# Patient Record
Sex: Female | Born: 1995
Health system: Southern US, Community
[De-identification: ages and names within clinical notes are randomized; demographics above are authoritative.]

## PROBLEM LIST (undated history)

## (undated) DIAGNOSIS — R011 Cardiac murmur, unspecified: Secondary | ICD-10-CM

## (undated) DIAGNOSIS — F419 Anxiety disorder, unspecified: Secondary | ICD-10-CM

## (undated) HISTORY — DX: Cardiac murmur, unspecified: R01.1

---
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
## 2016-04-02 ENCOUNTER — Other Ambulatory Visit: Payer: Self-pay | Admitting: Physician Assistant

## 2016-04-02 DIAGNOSIS — R42 Dizziness and giddiness: Secondary | ICD-10-CM

## 2016-04-12 ENCOUNTER — Other Ambulatory Visit: Payer: No Typology Code available for payment source

## 2016-05-10 ENCOUNTER — Other Ambulatory Visit: Payer: No Typology Code available for payment source

## 2016-06-26 ENCOUNTER — Other Ambulatory Visit: Payer: No Typology Code available for payment source

## 2016-08-09 ENCOUNTER — Other Ambulatory Visit: Payer: No Typology Code available for payment source

## 2016-08-16 ENCOUNTER — Ambulatory Visit
Admission: RE | Admit: 2016-08-16 | Discharge: 2016-08-16 | Disposition: A | Discharge status: Home / Self Care | Payer: BLUE CROSS/BLUE SHIELD | Source: Ambulatory Visit | Attending: Physician Assistant | Admitting: Physician Assistant

## 2016-08-16 ENCOUNTER — Other Ambulatory Visit: Payer: Self-pay | Admitting: Physician Assistant

## 2016-08-16 DIAGNOSIS — R42 Dizziness and giddiness: Secondary | ICD-10-CM

## 2017-09-09 IMAGING — MR MR HEAD W/O CM
8 series · 48 of 48 positions shown · IV contrast (agent unspecified)
Comparison: None.

CLINICAL DATA: 20-year-old female with dizziness, lightheadedness,
symptoms of feeling faint. Initial encounter.

A study without and with contrast was planned, but the patient
declined IV contrast.
EXAM:
MRI HEAD WITHOUT CONTRAST
TECHNIQUE: Multiplanar, multiecho pulse sequences of the brain and surrounding
structures were obtained without intravenous contrast.

[Series 6: DWI · axial · 3.0mm · 1.44mm/px · z∈[-89,+39]mm · 8 of 86 slices shown (1 of 2)]
[im 1/86]
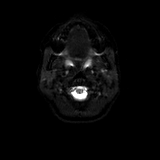
[im 13/86]
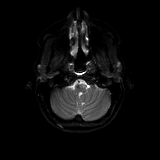
[im 25/86]
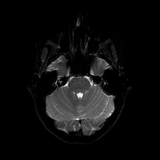
[im 37/86]
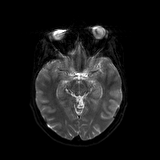
[im 49/86]
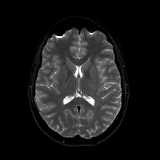
[im 61/86]
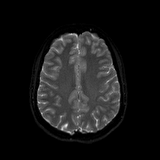
[im 73/86]
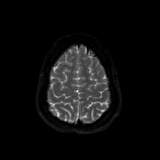
[im 86/86]
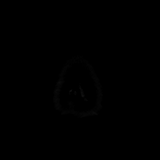

[Series 7: DWI · axial · 3.0mm · 1.44mm/px · z∈[-89,+39]mm · 4 of 42 slices shown (2 of 2)]
[im 1/42]
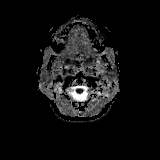
[im 14/42]
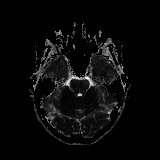
[im 28/42]
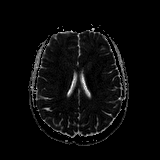
[im 42/42]
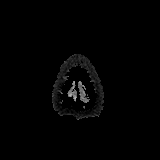

[Series 8: T2 · axial · 4.0mm · 0.36mm/px · z∈[-84,+43]mm · 3 of 27 slices shown (1 of 2)]
[im 1/27]
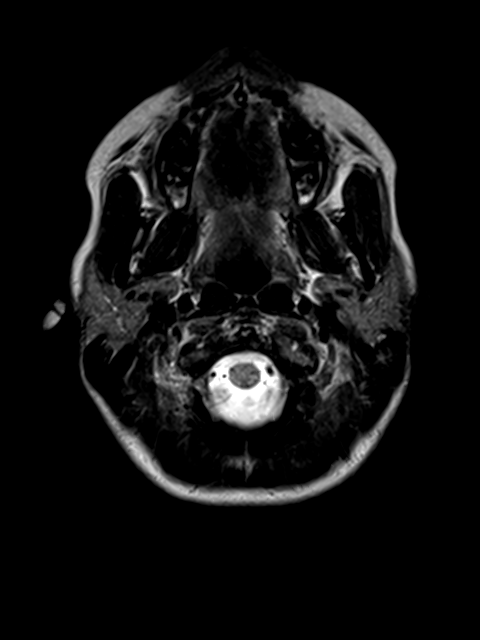
[im 14/27]
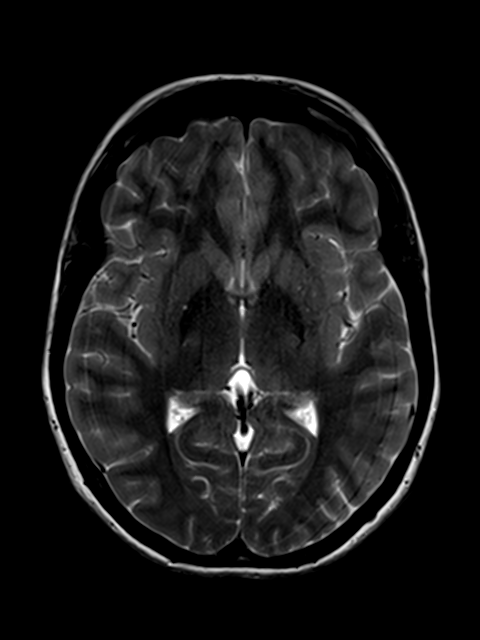
[im 27/27]
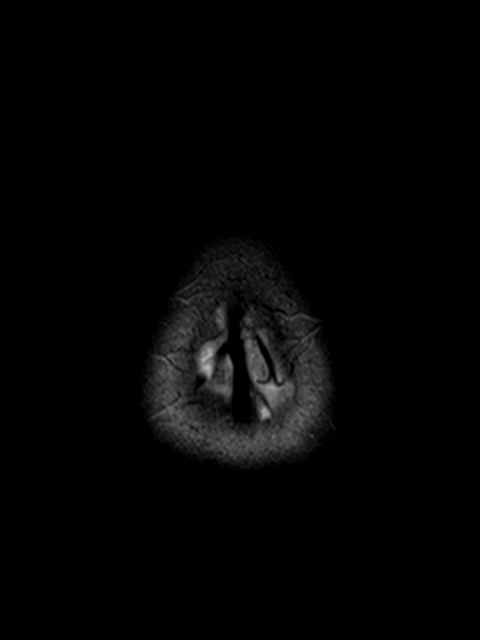

[Series 9: FLAIR · axial · 4.0mm · 0.72mm/px · z∈[-84,+43]mm · 3 of 27 slices shown]
[im 1/27]
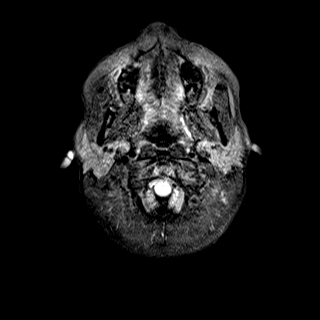
[im 14/27]
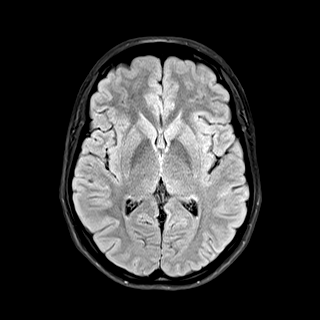
[im 27/27]
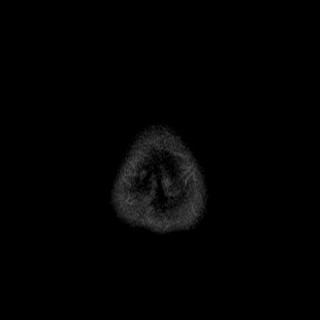

[Series 11: swi_images · axial · 1.5mm · 0.90mm/px · z∈[-87,+47]mm · 10 of 96 slices shown]
[im 1/96]
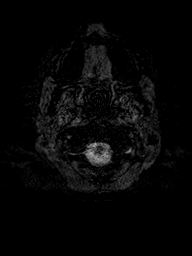
[im 11/96]
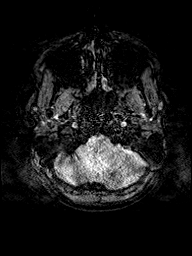
[im 22/96]
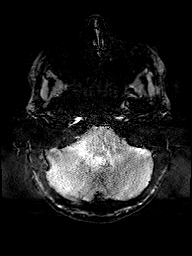
[im 32/96]
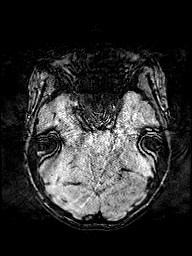
[im 43/96]
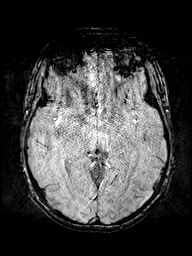
[im 53/96]
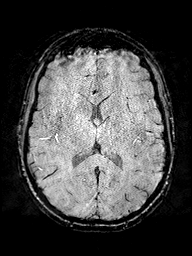
[im 64/96]
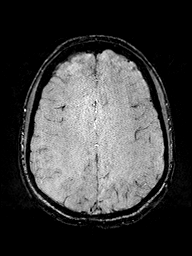
[im 74/96]
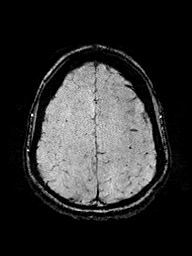
[im 85/96]
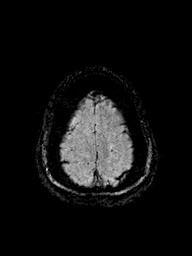
[im 96/96]
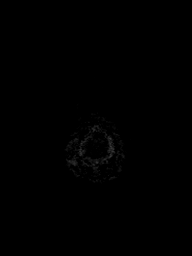

[Series 12: T1 · axial · 1.0mm · 0.90mm/px · z∈[-86,+48]mm · 14 of 144 slices shown (1 of 2)]
[im 1/144]
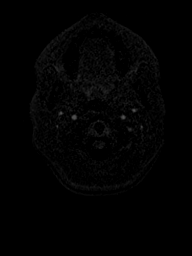
[im 12/144]
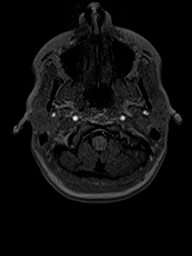
[im 23/144]
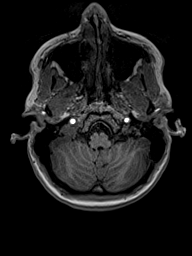
[im 34/144]
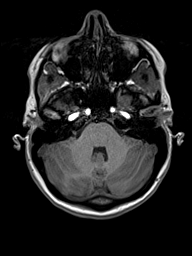
[im 45/144]
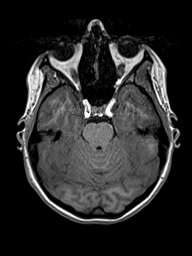
[im 56/144]
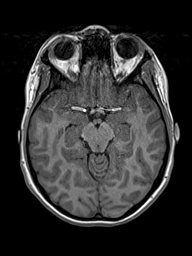
[im 67/144]
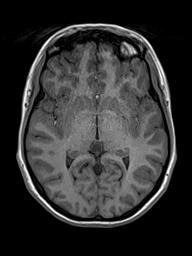
[im 78/144]
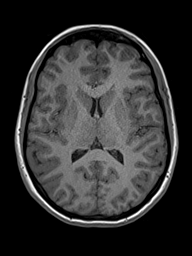
[im 89/144]
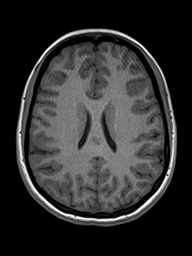
[im 100/144]
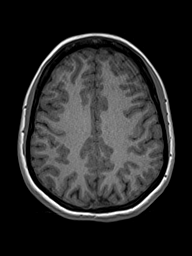
[im 111/144]
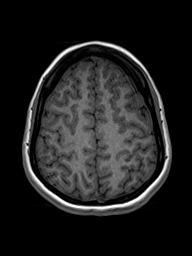
[im 122/144]
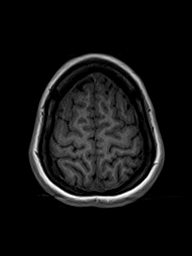
[im 133/144]
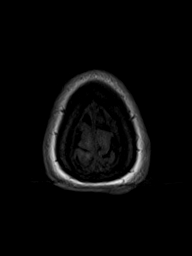
[im 144/144]
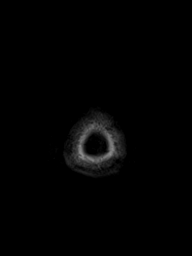

[Series 13: T2 · coronal · 4.5mm · 0.36mm/px · 3 of 30 slices shown (2 of 2)]
[im 1/30]
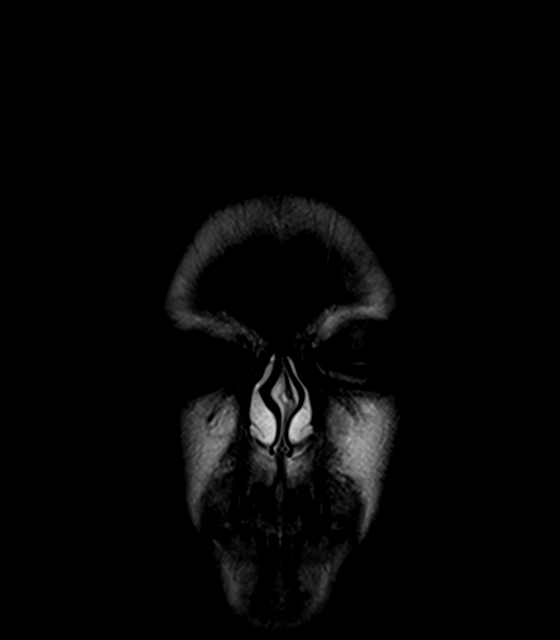
[im 15/30]
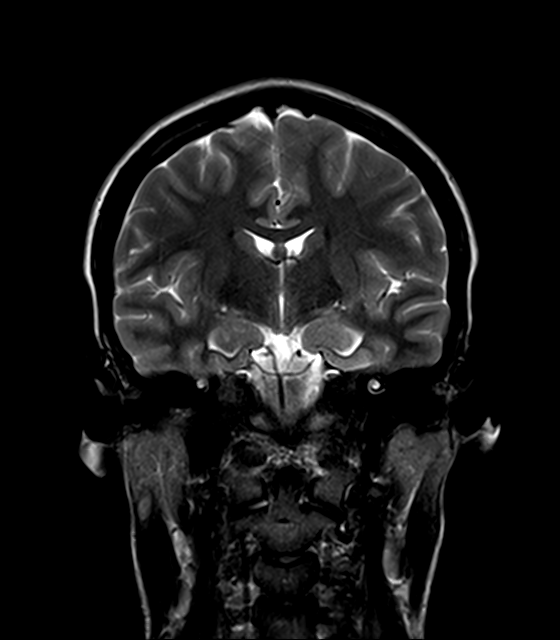
[im 30/30]
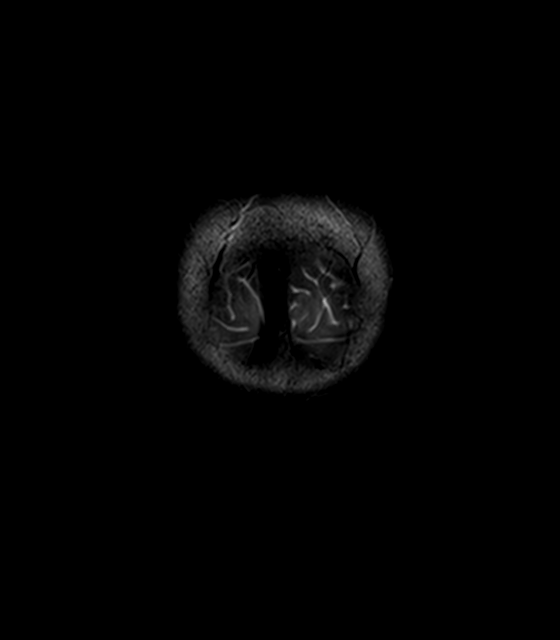

[Series 14: T1 · sagittal · 4.0mm · 0.75mm/px · 3 of 26 slices shown (2 of 2)]
[im 1/26]
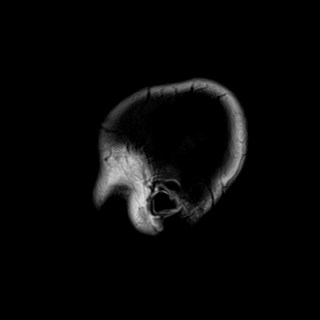
[im 13/26]
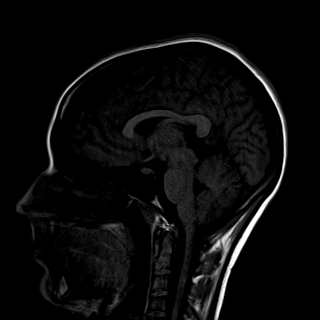
[im 26/26]
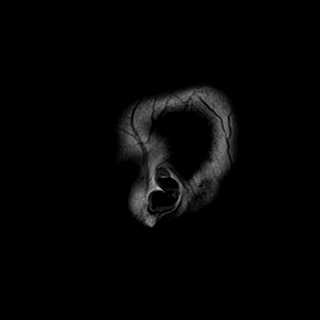

[48 of 48 positions shown; findings below may reference images not displayed]

FINDINGS: Brain: Cerebral volume is normal. No restricted diffusion to suggest
acute infarction. No midline shift, mass effect, evidence of mass
lesion, ventriculomegaly, extra-axial collection or acute
intracranial hemorrhage. Cervicomedullary junction and pituitary are
within normal limits. Small 8 mm cystic change of pineal gland,
normal variant. Gray and white matter signal is within normal limits
throughout the brain. No encephalomalacia or chronic cerebral blood
products.

Vascular: Major intracranial vascular flow voids appear normal,
probable fetal type bilateral PCA origins.

Skull and upper cervical spine: Negative. Visualized bone marrow
signal is within normal limits.

Sinuses/Orbits: Normal orbits soft tissues.

Paranasal sinuses and mastoids are clear.

Other: Visible internal auditory structures appear normal. Negative
scalp soft tissues.
IMPRESSION: Normal noncontrast MRI appearance of the brain.

## 2019-05-10 ENCOUNTER — Other Ambulatory Visit: Payer: Self-pay | Admitting: Adult Health

## 2019-05-15 ENCOUNTER — Ambulatory Visit (INDEPENDENT_AMBULATORY_CARE_PROVIDER_SITE_OTHER): Payer: BLUE CROSS/BLUE SHIELD | Admitting: Adult Health

## 2019-05-15 ENCOUNTER — Encounter: Payer: Self-pay | Admitting: Adult Health

## 2019-05-15 DIAGNOSIS — F411 Generalized anxiety disorder: Secondary | ICD-10-CM | POA: Diagnosis not present

## 2019-05-15 DIAGNOSIS — F331 Major depressive disorder, recurrent, moderate: Secondary | ICD-10-CM

## 2019-05-15 MED ORDER — ESCITALOPRAM OXALATE 10 MG PO TABS: 10.0000 mg | ORAL_TABLET | Freq: Every day | ORAL | 3 refills | Status: DC

## 2019-05-15 NOTE — Progress Notes (Signed)
Tara Hull [id number] [birth year] 23 y.o.  Virtual Visit via Telephone Note  I connected with pt on 05/15/19 at 11:40 AM EST by telephone and verified that I am speaking with the correct person using two identifiers.   I discussed the limitations, risks, security and privacy concerns of performing an evaluation and management service by telephone and the availability of in person appointments. I also discussed with the patient that there may be a patient responsible charge related to this service. The patient expressed understanding and agreed to proceed.   I discussed the assessment and treatment plan with the patient. The patient was provided an opportunity to ask questions and all were answered. The patient agreed with the plan and demonstrated an understanding of the instructions.   The patient was advised to call back or seek an in-person evaluation if the symptoms worsen or if the condition fails to improve as anticipated.  I provided 30 minutes of non-face-to-face time during this encounter.  The patient was located at home.  The provider was located at [place].   Aloha Gell, NP   Subjective:   Patient ID:  Tara Hull is a 23 y.o. (DOB March 01, 1996) female.  Chief Complaint: No chief complaint on file.  HPI [name] presents for follow-up of anxiety and depression.  Describes mood today as "pretty good". Pleasant. Mood symptoms - denies depression, anxiety, and irritability. Stating "I've been doing really well". Stable interest and motivation. Taking medications as prescribed.  Energy levels stable. Active, does not have a regular exercise routine. Working 40 hours a week.  Enjoys some usual interests and activities. Married. Lives with husband 2 dogs. Spending time with family. Parents recently divorced after 2 years of separation.  Appetite adequate. Weight stable. Sleeps well most nights. Averages 6 to 8 hours. Focus and  concentration stable. Completing tasks. Managing aspects of household. Work going well Careers information officer. Working on [employer].  Denies SI or HI. Denies AH or VH.  Review of Systems:  Review of Systems  Musculoskeletal: Negative for gait problem.  Neurological: Negative for tremors.  Psychiatric/Behavioral:       Please refer to HPI    Medications: I have reviewed the patient's current medications.  Current Outpatient Medications  Medication Sig Dispense Refill  . escitalopram (LEXAPRO) 10 MG tablet Take 1 tablet (10 mg total) by mouth daily. 90 tablet 3   No current facility-administered medications for this visit.    Medication Side Effects: None  Allergies: No Known Allergies  No past medical history on file.  Family History  Problem Relation Age of Onset  . Mental retardation Brother   . Other Brother        cleidocranial dysplasia    Social History   Socioeconomic History  . Marital status: Single    Spouse name: Not on file  . Number of children: Not on file  . Years of education: Not on file  . Highest education level: Not on file  Occupational History  . Not on file  Tobacco Use  . Smoking status: Never Smoker  . Smokeless tobacco: Never Used  Substance and Sexual Activity  . Alcohol use: No  . Drug use: No  . Sexual activity: Never  Other Topics Concern  . 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 Lives with her parents and younger brothers.   HC: 52.9 cm   PHQ-9  Total Score: 12   SCARED Total Score: 12   Social Determinants of Health   Financial Resource Strain:   . Difficulty of Paying Living Expenses: Not on file  Food Insecurity:   . Worried About Programme researcher, broadcasting/film/videounning Out of Food in the Last Year: Not on file  . Ran Out of Food in the Last Year: Not on file  Transportation Needs:   . Lack of Transportation (Medical): Not on file  . Lack of Transportation (Non-Medical): Not on file  Physical  Activity:   . Days of Exercise per Week: Not on file  . Minutes of Exercise per Session: Not on file  Stress:   . Feeling of Stress : Not on file  Social Connections:   . Frequency of Communication with Friends and Family: Not on file  . Frequency of Social Gatherings with Friends and Family: Not on file  . Attends Religious Services: Not on file  . Active Member of Clubs or Organizations: Not on file  . Attends BankerClub or Organization Meetings: Not on file  . Marital Status: Not on file  Intimate Partner Violence:   . Fear of Current or Ex-Partner: Not on file  . Emotionally Abused: Not on file  . Physically Abused: Not on file  . Sexually Abused: Not on file    Past Medical History, Surgical history, Social history, and Family history were reviewed and updated as appropriate.   Please see review of systems for further details on the patient's review from today.   Objective:   Physical Exam:  There were no vitals taken for this visit.  Physical Exam Constitutional:      General: She is not in acute distress.    Appearance: She is well-developed.  Musculoskeletal:        General: No deformity.  Neurological:     Mental Status: She is alert and oriented to person, place, and time.     Coordination: Coordination normal.  Psychiatric:        Attention and Perception: Attention and perception normal. She does not perceive auditory or visual hallucinations.        Mood and Affect: Mood normal. Mood is not anxious or depressed. Affect is not labile, blunt, angry or inappropriate.        Speech: Speech normal.        Behavior: Behavior normal.        Thought Content: Thought content normal. Thought content is not paranoid or delusional. Thought content does not include homicidal or suicidal ideation. Thought content does not include homicidal or suicidal plan.        Cognition and Memory: Cognition and memory normal.        Judgment: Judgment normal.     Comments: Insight intact     Lab Review:  No results found for: NA, K, CL, CO2, GLUCOSE, BUN, CREATININE, CALCIUM, PROT, ALBUMIN, AST, ALT, ALKPHOS, BILITOT, GFRNONAA, GFRAA  No results found for: WBC, RBC, HGB, HCT, PLT, MCV, MCH, MCHC, RDW, LYMPHSABS, MONOABS, EOSABS, BASOSABS  No results found for: POCLITH, LITHIUM   No results found for: PHENYTOIN, PHENOBARB, VALPROATE, CBMZ   .res Assessment: Plan:    Plan:  1. Lexapro 10mg  daily 5mg /2.5mg    RTC 1 year  Patient advised to contact office with any questions, adverse effects, or acute worsening in signs and symptoms.  Diagnoses and all orders for this visit:  Generalized anxiety disorder -     escitalopram (LEXAPRO) 10 MG tablet; Take 1 tablet (10 mg total)  by mouth daily.  Major depressive disorder, recurrent episode, moderate (HCC) -     escitalopram (LEXAPRO) 10 MG tablet; Take 1 tablet (10 mg total) by mouth daily.    Please see After Visit Summary for patient specific instructions.  No future appointments.  No orders of the defined types were placed in this encounter.     -------------------------------

## 2019-06-17 ENCOUNTER — Other Ambulatory Visit: Payer: Self-pay | Admitting: Adult Health

## 2019-06-17 DIAGNOSIS — F331 Major depressive disorder, recurrent, moderate: Secondary | ICD-10-CM

## 2019-06-17 DIAGNOSIS — F411 Generalized anxiety disorder: Secondary | ICD-10-CM

## 2019-06-21 ENCOUNTER — Telehealth: Payer: Self-pay | Admitting: Adult Health

## 2019-06-21 ENCOUNTER — Other Ambulatory Visit: Payer: Self-pay

## 2019-06-21 DIAGNOSIS — F331 Major depressive disorder, recurrent, moderate: Secondary | ICD-10-CM

## 2019-06-21 DIAGNOSIS — F411 Generalized anxiety disorder: Secondary | ICD-10-CM

## 2019-06-21 MED ORDER — ESCITALOPRAM OXALATE 10 MG PO TABS: 10.0000 mg | ORAL_TABLET | Freq: Every day | ORAL | 3 refills | Status: DC

## 2019-06-21 NOTE — Telephone Encounter (Signed)
Rx for Lexapro submitted to updated [facility] in [place]

## 2019-06-21 NOTE — Telephone Encounter (Signed)
Pt would like her lexapro and others that need refill  to be sent to new pharmacy [facility] [address] [place] [address].

## 2019-06-28 ENCOUNTER — Telehealth: Payer: Self-pay | Admitting: Adult Health

## 2019-06-28 NOTE — Telephone Encounter (Signed)
Tara Hull called to ask if she can take a powder drink w/ magnesium and ashwagandha with the lexapro she takes.  She said it is to help with anxiety.  Please call to let her know what you think.

## 2019-06-28 NOTE — Telephone Encounter (Signed)
I would need to research this.

## 2019-06-30 NOTE — Telephone Encounter (Signed)
Pt. Made aware.

## 2019-12-20 ENCOUNTER — Emergency Department (HOSPITAL_COMMUNITY)
Admission: EM | Admit: 2019-12-20 | Discharge: 2019-12-21 | Disposition: A | Discharge status: Home / Self Care | Payer: BC Managed Care – PPO | Attending: Emergency Medicine | Admitting: Emergency Medicine

## 2019-12-20 DIAGNOSIS — R42 Dizziness and giddiness: Secondary | ICD-10-CM | POA: Diagnosis present

## 2019-12-20 DIAGNOSIS — Z5321 Procedure and treatment not carried out due to patient leaving prior to being seen by health care provider: Secondary | ICD-10-CM | POA: Insufficient documentation

## 2019-12-21 ENCOUNTER — Encounter (HOSPITAL_COMMUNITY): Payer: Self-pay | Admitting: Emergency Medicine

## 2019-12-21 ENCOUNTER — Other Ambulatory Visit: Payer: Self-pay

## 2019-12-21 LAB — URINALYSIS, ROUTINE W REFLEX MICROSCOPIC
Bilirubin Urine: NEGATIVE
Glucose, UA: NEGATIVE mg/dL
Hgb urine dipstick: NEGATIVE
Ketones, ur: NEGATIVE mg/dL
Leukocytes,Ua: NEGATIVE
Nitrite: NEGATIVE
Protein, ur: NEGATIVE mg/dL
Specific Gravity, Urine: 1.005 (ref 1.005–1.030)
pH: 7 (ref 5.0–8.0)

## 2019-12-21 LAB — CBC
HCT: 42.2 % (ref 36.0–46.0)
Hemoglobin: 14 g/dL (ref 12.0–15.0)
MCH: 29.2 pg (ref 26.0–34.0)
MCHC: 33.2 g/dL (ref 30.0–36.0)
MCV: 88.1 fL (ref 80.0–100.0)
Platelets: 258 10*3/uL (ref 150–400)
RBC: 4.79 MIL/uL (ref 3.87–5.11)
RDW: 11.9 % (ref 11.5–15.5)
WBC: 7.6 10*3/uL (ref 4.0–10.5)
nRBC: 0 % (ref 0.0–0.2)

## 2019-12-21 LAB — I-STAT BETA HCG BLOOD, ED (MC, WL, AP ONLY): I-stat hCG, quantitative: <5 m[IU]/mL (ref <5)

## 2019-12-21 LAB — BASIC METABOLIC PANEL
Anion gap: 9 (ref 5–15)
BUN: 15 mg/dL (ref 6–20)
CO2: 27 mmol/L (ref 22–32)
Calcium: 9.6 mg/dL (ref 8.9–10.3)
Chloride: 103 mmol/L (ref 98–111)
Creatinine, Ser: 0.88 mg/dL (ref 0.44–1.00)
GFR calc Af Amer: >60 mL/min (ref >60)
GFR calc non Af Amer: >60 mL/min (ref >60)
Glucose, Bld: 154 mg/dL — ABNORMAL HIGH (ref 70–99)
Potassium: 3.5 mmol/L (ref 3.5–5.1)
Sodium: 139 mmol/L (ref 135–145)

## 2019-12-21 MED ORDER — SODIUM CHLORIDE 0.9% FLUSH: 3.0000 mL | Freq: Once | INTRAVENOUS | Status: DC

## 2019-12-21 NOTE — ED Triage Notes (Signed)
Pt presents to ED POV. Pt reports dizziness and weakness in all extremities. Pt reports that OB prescribed progesterone and 58m after she had a episode of dizziness and weakness @ 2130. Pt reports no neuro deficits at this time. NIHH - 0.

## 2019-12-21 NOTE — ED Notes (Signed)
Patient states she called her ob and has an appointment for the morning.

## 2020-05-25 NOTE — L&D Delivery Note (Signed)
Operative Delivery Note At 11:20 PM a viable female was delivered via Vaginal, Spontaneous.  Presentation: vertex; Position: Left,, Occiput,, Anterior; Station: +3.  Verbal consent: obtained from patient.  Risks and benefits discussed in detail.  Risks include, but are not limited to the risks of anesthesia, bleeding, infection, damage to maternal tissues, fetal cephalhematoma.  There is also the risk of inability to effect vaginal delivery of the head, or shoulder dystocia that cannot be resolved by established maneuvers, leading to the need for emergency cesarean section. She pushed with excellent effort and fetal HR decel to 70s x 3 minutes. Expeditious delivery recommended. Kiwi appled without any pop offs and gently over one contraction to deliver easily the head followed by a nuchal x 1.   APGAR: 7, 8; weight 6 lb 10.4 oz (3016 g).   Placenta status: routine Cord:  with the following complications: nuchel x 1 .  Cord pH: not sent  Anesthesia:  CLE Instruments: kiwi Episiotomy: None Lacerations: 2nd degree Suture Repair: 2.0 3.0 vicryl Est. Blood Loss (mL): 200   Mom to postpartum.  Baby to Couplet care / Skin to Skin.  Ranae Pila 02/02/2021, 12:45 AM

## 2020-06-03 ENCOUNTER — Telehealth: Payer: Self-pay | Admitting: Adult Health

## 2020-06-03 NOTE — Telephone Encounter (Signed)
Tara Hull called to report that she is pregnant.  She would like a call asap as to how to wean off her medications that may hurt the baby.  Please call to discuss instructions on her medications.

## 2020-06-03 NOTE — Telephone Encounter (Signed)
She has not been seen here in over a year. She was on Lexapro at last visit. The Lexapro is usually fine to take during pregnancy, but she will need to consult with her OB.

## 2020-06-06 ENCOUNTER — Ambulatory Visit: Payer: BC Managed Care – PPO | Admitting: Physician Assistant

## 2020-06-07 NOTE — Telephone Encounter (Signed)
I spoke with Tara Hull and let her know that the Lexapro is usually ok during pregnancy but she should consult her OB.  She acknowledged this and said she would check with her Dr.

## 2020-06-09 NOTE — Telephone Encounter (Signed)
Noted  

## 2020-06-27 LAB — OB RESULTS CONSOLE GC/CHLAMYDIA
Chlamydia: NEGATIVE
Gonorrhea: NEGATIVE

## 2020-06-27 LAB — OB RESULTS CONSOLE ABO/RH: RH Type: POSITIVE

## 2020-06-27 LAB — OB RESULTS CONSOLE ANTIBODY SCREEN: Antibody Screen: NEGATIVE

## 2020-06-27 LAB — OB RESULTS CONSOLE HIV ANTIBODY (ROUTINE TESTING): HIV: NONREACTIVE

## 2020-06-27 LAB — OB RESULTS CONSOLE HEPATITIS B SURFACE ANTIGEN: Hepatitis B Surface Ag: NEGATIVE

## 2020-06-27 LAB — OB RESULTS CONSOLE RUBELLA ANTIBODY, IGM: Rubella: IMMUNE

## 2020-06-27 LAB — OB RESULTS CONSOLE RPR: RPR: NONREACTIVE

## 2020-07-07 ENCOUNTER — Ambulatory Visit (HOSPITAL_COMMUNITY)
Admission: EM | Admit: 2020-07-07 | Discharge: 2020-07-07 | Disposition: A | Discharge status: Still In Hospital | Payer: BC Managed Care – PPO

## 2020-07-07 ENCOUNTER — Emergency Department (HOSPITAL_COMMUNITY)
Admission: EM | Admit: 2020-07-07 | Discharge: 2020-07-07 | Disposition: A | Discharge status: Home / Self Care | Payer: BC Managed Care – PPO | Attending: Emergency Medicine | Admitting: Emergency Medicine

## 2020-07-07 ENCOUNTER — Encounter (HOSPITAL_COMMUNITY): Payer: Self-pay | Admitting: Emergency Medicine

## 2020-07-07 ENCOUNTER — Emergency Department (HOSPITAL_COMMUNITY): Payer: BC Managed Care – PPO

## 2020-07-07 ENCOUNTER — Other Ambulatory Visit: Payer: Self-pay

## 2020-07-07 DIAGNOSIS — Z3A09 9 weeks gestation of pregnancy: Secondary | ICD-10-CM | POA: Insufficient documentation

## 2020-07-07 DIAGNOSIS — O99511 Diseases of the respiratory system complicating pregnancy, first trimester: Secondary | ICD-10-CM | POA: Insufficient documentation

## 2020-07-07 DIAGNOSIS — R070 Pain in throat: Secondary | ICD-10-CM | POA: Insufficient documentation

## 2020-07-07 DIAGNOSIS — R079 Chest pain, unspecified: Secondary | ICD-10-CM | POA: Insufficient documentation

## 2020-07-07 MED ORDER — LIDOCAINE VISCOUS HCL 2 % MT SOLN
15.0000 mL | Freq: Once | OROMUCOSAL | Status: AC
  Administered 2020-07-07: 15 mL via ORAL
  Filled 2020-07-07: qty 15

## 2020-07-07 MED ORDER — ALUM & MAG HYDROXIDE-SIMETH 200-200-20 MG/5ML PO SUSP
30.0000 mL | Freq: Once | ORAL | Status: AC
  Administered 2020-07-07: 30 mL via ORAL
  Filled 2020-07-07: qty 30

## 2020-07-07 NOTE — ED Provider Notes (Incomplete)
  [facility] EMERGENCY DEPARTMENT Provider Note   CSN: [id number] Arrival date & time: 07/07/20  1530     History Chief Complaint  Patient presents with  . swallowed gum    [redacted] weeks pregnant    Tara Hull is a 25 y.o. female.  HPI     History reviewed. No pertinent past medical history.  Patient Active Problem List   Diagnosis Date Noted  . Dizziness and giddiness 07/04/2015  . Orthostatic hypotension 07/04/2015    Past Surgical History:  Procedure Laterality Date  . WISDOM TOOTH EXTRACTION  2015     OB History   No obstetric history on file.     Family History  Problem Relation Age of Onset  . Mental retardation Brother   . Other Brother        cleidocranial dysplasia    Social History   Tobacco Use  . Smoking status: Never Smoker  . Smokeless tobacco: Never Used  Substance Use Topics  . Alcohol use: No  . Drug use: No    Home Medications Prior to Admission medications   Medication Sig Start Date End Date Taking? Authorizing Provider  escitalopram (LEXAPRO) 10 MG tablet Take 1 tablet (10 mg total) by mouth daily. 06/21/19   Mozingo, Thereasa Solo, NP    Allergies    Patient has no known allergies.  Review of Systems   Review of Systems  Physical Exam Updated Vital Signs BP 135/81 (BP Location: Right Arm)   Pulse (!) 105   Temp 98.5 F (36.9 C) (Oral)   Resp 18   SpO2 100%   Physical Exam  ED Results / Procedures / Treatments   Labs (all labs ordered are listed, but only abnormal results are displayed) Labs Reviewed - No data to display  EKG None  Radiology No results found.  Procedures Procedures {Remember to document critical care time when appropriate:1}  Medications Ordered in ED Medications - No data to display  ED Course  I have reviewed the triage vital signs and the nursing notes.  Pertinent labs & imaging results that were available during my care of the patient were reviewed by me  and considered in my medical decision making (see chart for details).    MDM Rules/Calculators/A&P                          *** Final Clinical Impression(s) / ED Diagnoses Final diagnoses:  None    Rx / DC Orders ED Discharge Orders    None

## 2020-07-07 NOTE — ED Provider Notes (Signed)
[facility] EMERGENCY DEPARTMENT Provider Note   CSN: [id number] Arrival date & time: 07/07/20  1530     History Chief Complaint  Patient presents with  . swallowed gum    [redacted] weeks pregnant    Tara Hull is a 25 y.o. female with a h/o orthostatic hypotension who presents to the ED with her husband for throat pain. Swallowed a piece of gum yesterday at approximately 4PM. Later than evening, she began having pain in her throat and upper chest at the base of her neck as if something was stuck. Tolerating fluids and solids but still having discomfort today, prompting her to come to ED today. Denies vomiting, nausea, SOB, abdominal pain, fever, chills, or diarrhea.  The history is provided by the patient and medical records.  Illness Location:  Throat Quality:  Foreign body sensation Severity:  Mild Onset quality:  Sudden Duration:  1 day Timing:  Constant Progression:  Unchanged Chronicity:  New Context:  Swallowed piece of gum Relieved by:  Nothing Worsened by:  Nothing Ineffective treatments:  Fluids, Tums Associated symptoms: chest pain   Associated symptoms: no abdominal pain, no cough, no diarrhea, no ear pain, no fever, no nausea, no rash, no shortness of breath, no sore throat and no vomiting        History reviewed. No pertinent past medical history.  Patient Active Problem List   Diagnosis Date Noted  . Dizziness and giddiness 07/04/2015  . Orthostatic hypotension 07/04/2015    Past Surgical History:  Procedure Laterality Date  . WISDOM TOOTH EXTRACTION  2015     OB History   No obstetric history on file.     Family History  Problem Relation Age of Onset  . Mental retardation Brother   . Other Brother        cleidocranial dysplasia    Social History   Tobacco Use  . Smoking status: Never Smoker  . Smokeless tobacco: Never Used  Substance Use Topics  . Alcohol use: No  . Drug use: No    Home Medications Prior to  Admission medications   Medication Sig Start Date End Date Taking? Authorizing Provider  escitalopram (LEXAPRO) 10 MG tablet Take 1 tablet (10 mg total) by mouth daily. 06/21/19   Mozingo, Thereasa Solo, NP    Allergies    Patient has no known allergies.  Review of Systems   Review of Systems  Constitutional: Negative for chills and fever.  HENT: Negative for ear pain, sore throat and trouble swallowing.   Eyes: Negative for pain and visual disturbance.  Respiratory: Negative for cough and shortness of breath.   Cardiovascular: Positive for chest pain. Negative for palpitations.  Gastrointestinal: Negative for abdominal pain, diarrhea, nausea and vomiting.  Genitourinary: Negative for dysuria and hematuria.  Musculoskeletal: Negative for arthralgias and back pain.  Skin: Negative for color change and rash.  Neurological: Negative for seizures and syncope.  All other systems reviewed and are negative.   Physical Exam Updated Vital Signs BP 105/61 (BP Location: Right Arm)   Pulse 82   Temp 98.5 F (36.9 C) (Oral)   Resp 16   SpO2 97%   Physical Exam Vitals and nursing note reviewed.  Constitutional:      General: She is awake. She is not in acute distress.    Appearance: Normal appearance. She is well-developed, well-groomed and well-nourished. She is not ill-appearing.  HENT:     Head: Normocephalic and atraumatic.     Right  Ear: External ear normal.     Left Ear: External ear normal.     Nose: Nose normal.  Eyes:     General: No scleral icterus.       Right eye: No discharge.        Left eye: No discharge.     Conjunctiva/sclera: Conjunctivae normal.  Cardiovascular:     Rate and Rhythm: Normal rate and regular rhythm.  Pulmonary:     Effort: Pulmonary effort is normal. No respiratory distress.  Abdominal:     General: Abdomen is flat. There is no distension.     Palpations: Abdomen is soft.     Tenderness: There is no abdominal tenderness. There is no guarding  or rebound.  Musculoskeletal:        General: No edema.     Cervical back: Neck supple.  Skin:    General: Skin is warm and dry.     Findings: No rash.  Neurological:     General: No focal deficit present.     Mental Status: She is alert and oriented to person, place, and time.     Sensory: No sensory deficit.     Motor: No weakness.  Psychiatric:        Mood and Affect: Mood and affect and mood normal.        Behavior: Behavior normal. Behavior is cooperative.     ED Results / Procedures / Treatments   Labs (all labs ordered are listed, but only abnormal results are displayed) Labs Reviewed - No data to display  EKG None  Radiology DG Chest Portable 1 View  Result Date: 07/07/2020 CLINICAL DATA:  Swallowed foreign body. EXAM: PORTABLE CHEST 1 VIEW COMPARISON:  None. FINDINGS: The cardiac silhouette, mediastinal and hilar contours are normal. The lungs are clear.  No pleural effusions.  No pulmonary lesions. The upper abdominal bowel gas pattern is unremarkable. No radiopaque foreign bodies are identified. The bony thorax is intact. IMPRESSION: Normal chest x-ray. Electronically Signed   By: Rudie Meyer M.D.   On: 07/07/2020 17:11    Procedures Procedures  Medications Ordered in ED Medications  alum & mag hydroxide-simeth (MAALOX/MYLANTA) 200-200-20 MG/5ML suspension 30 mL (30 mLs Oral Given 07/07/20 1656)    And  lidocaine (XYLOCAINE) 2 % viscous mouth solution 15 mL (15 mLs Oral Given 07/07/20 1656)    ED Course  I have reviewed the triage vital signs and the nursing notes.  Pertinent labs & imaging results that were available during my care of the patient were reviewed by me and considered in my medical decision making (see chart for details).    MDM Rules/Calculators/A&P                          Patient is a well-appearing 24yoF with history and physical as described above who presents to the ED for throat pain. VS reassuring and HDS. Resting comfortably and in  no acute distress. Tolerating fluids in ED. No episodes of emesis. Initial workup includes CXR. Initial treatment includes viscous lidocaine and Maalox.  On reassessment, no significant relief from GI cocktail. CXR reassuring for esophageal rupture, widened mediastinum, PNA, aspiration, or PTX. Given reassuring VS, physical exam, and diagnostic workup, low suspicion for acute emergent pathology at this time. Suspect presentation likely 2/2 gastric reflux vs pregnancy. Recommend she follow up with PCP or OB if symptoms do not improve to discuss anti-reflux medication that is safe in pregnancy. Strict return precautions  provided and discussed. Questions and concerns were addressed. Patient verbalized understanding and amenable with discharge plan. Patient discharged in stable condition. Final Clinical Impression(s) / ED Diagnoses Final diagnoses:  Pain in throat    Rx / DC Orders ED Discharge Orders    None       Tonia Brooms, MD 07/08/20 0136    Virgina Norfolk, DO 07/08/20 2333

## 2020-07-07 NOTE — ED Triage Notes (Addendum)
Pt states she swallowed her gum yesterday around 4pm and has discomfort in her throat/esophagus.  States she is [redacted] weeks pregnant.

## 2020-07-07 NOTE — ED Provider Notes (Signed)
I have personally seen and examined the patient. I have reviewed the documentation on PMH/FH/Soc Hx. I have discussed the plan of care with the resident and patient.  I have reviewed and agree with the resident's documentation. Please see associated encounter note.  Briefly, the patient is a 25 y.o. female here with swallowed gum.  No respiratory symptoms.  Chest x-ray normal.  Overall patient with some mild reflux symptoms.  Was able to tolerate p.o. including solids since this happened as well.  Given reassurance and discharged from ED in good condition.  If continues have reflux symptoms recommend talking with OB about that as patient is [redacted] weeks pregnant and symptoms could be secondary to pregnancy.  Discharged in good condition.  This chart was dictated using voice recognition software.  Despite best efforts to proofread,  errors can occur which can change the documentation meaning.     EKG Interpretation None         Virgina Norfolk, DO 07/07/20 1733

## 2020-08-18 ENCOUNTER — Inpatient Hospital Stay (HOSPITAL_BASED_OUTPATIENT_CLINIC_OR_DEPARTMENT_OTHER): Payer: BC Managed Care – PPO

## 2020-08-18 ENCOUNTER — Inpatient Hospital Stay: Payer: BC Managed Care – PPO

## 2020-08-18 ENCOUNTER — Encounter (HOSPITAL_COMMUNITY): Payer: Self-pay

## 2020-08-18 ENCOUNTER — Other Ambulatory Visit: Payer: Self-pay

## 2020-08-18 ENCOUNTER — Inpatient Hospital Stay (HOSPITAL_COMMUNITY)
Admission: AD | Admit: 2020-08-18 | Discharge: 2020-08-18 | Disposition: A | Discharge status: Home / Self Care | Payer: BC Managed Care – PPO | Source: Ambulatory Visit | Attending: Obstetrics and Gynecology | Admitting: Obstetrics and Gynecology

## 2020-08-18 DIAGNOSIS — R109 Unspecified abdominal pain: Secondary | ICD-10-CM | POA: Diagnosis not present

## 2020-08-18 DIAGNOSIS — O26892 Other specified pregnancy related conditions, second trimester: Secondary | ICD-10-CM | POA: Diagnosis present

## 2020-08-18 DIAGNOSIS — Z0371 Encounter for suspected problem with amniotic cavity and membrane ruled out: Secondary | ICD-10-CM | POA: Insufficient documentation

## 2020-08-18 DIAGNOSIS — Z3A15 15 weeks gestation of pregnancy: Secondary | ICD-10-CM

## 2020-08-18 DIAGNOSIS — R102 Pelvic and perineal pain: Secondary | ICD-10-CM | POA: Diagnosis not present

## 2020-08-18 DIAGNOSIS — Z79899 Other long term (current) drug therapy: Secondary | ICD-10-CM | POA: Diagnosis not present

## 2020-08-18 DIAGNOSIS — O4290 Premature rupture of membranes, unspecified as to length of time between rupture and onset of labor, unspecified weeks of gestation: Secondary | ICD-10-CM

## 2020-08-18 DIAGNOSIS — N898 Other specified noninflammatory disorders of vagina: Secondary | ICD-10-CM

## 2020-08-18 LAB — AMNISURE RUPTURE OF MEMBRANE (ROM) NOT AT ARMC: Amnisure ROM: NEGATIVE

## 2020-08-18 LAB — WET PREP, GENITAL
Clue Cells Wet Prep HPF POC: NONE SEEN
Sperm: NONE SEEN
Trich, Wet Prep: NONE SEEN
Yeast Wet Prep HPF POC: NONE SEEN

## 2020-08-18 LAB — URINALYSIS, ROUTINE W REFLEX MICROSCOPIC
Bilirubin Urine: NEGATIVE
Glucose, UA: NEGATIVE mg/dL
Hgb urine dipstick: NEGATIVE
Ketones, ur: NEGATIVE mg/dL
Leukocytes,Ua: NEGATIVE
Nitrite: NEGATIVE
Protein, ur: NEGATIVE mg/dL
Specific Gravity, Urine: <1.005 — ABNORMAL LOW (ref 1.005–1.030)
pH: 6 (ref 5.0–8.0)

## 2020-08-18 NOTE — Discharge Instructions (Signed)
Reasons to return to MAU at [facility]:  Since you are preterm, return to MAU if:  1.  Contractions are 10 minutes apart or less and they becoming more uncomfortable or painful over time 2.  You have a large gush of fluid, or a trickle of fluid that will not stop and you have to wear a pad 3.  You have bleeding that is bright red, heavier than spotting--like menstrual bleeding (spotting can be normal in early labor or after a check of your cervix) 4.  You do not feel the baby moving like he/she normally does  

## 2020-08-18 NOTE — MAU Note (Signed)
Pt reports to mau with reports of leaking a clear fluid and cramping for the past 4 days.  Pt denies bleeding. Denies vag itching or urinary urgency.

## 2020-08-18 NOTE — MAU Provider Note (Signed)
Chief Complaint: leaking clear fluid and cramping and Abdominal Pain   Event Date/Time   First Provider Initiated Contact with Patient 08/18/20 1824      SUBJECTIVE HPI: Tara Hull is a 25 y.o. G1P0 at [redacted]w[redacted]d by LMP who presents to maternity admissions reporting leaking clear fluid x 4 days. She reports mild abdominal cramping bilaterally as an associated symptom. The leaking is clear, enough to soak a pantyliner 1-2 times per day but not enough to require a pad. There is no itching/burning or odor.    Location: lower/mid abdomen Quality: cramping Severity: 3/10 on pain scale Duration: 4 days Timing: intermittent, irregular Modifying factors: none Associated signs and symptoms: leaking fluid  HPI  History reviewed. No pertinent past medical history. Past Surgical History:  Procedure Laterality Date  . WISDOM TOOTH EXTRACTION  2015   Social History   Socioeconomic History  . Marital status: Married    Spouse name: Not on file  . Number of children: Not on file  . Years of education: Not on file  . Highest education level: Not on file  Occupational History  . Not on file  Tobacco Use  . Smoking status: Never Smoker  . Smokeless tobacco: Never Used  Substance and Sexual Activity  . Alcohol use: No  . Drug use: No  . Sexual activity: Never  Other Topics Concern  . 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 She works at [employer]. Lives with her parents and younger brothers.   HC: 52.9 cm   PHQ-9 Total Score: 12   SCARED Total Score: 12   Social Determinants of Health   Financial Resource Strain: Not on file  Food Insecurity: Not on file  Transportation Needs: Not on file  Physical Activity: Not on file  Stress: Not on file  Social Connections: Not on file  Intimate Partner Violence: Not on file   No current facility-administered medications on file prior to encounter.   Current Outpatient Medications on File  Prior to Encounter  Medication Sig Dispense Refill  . Prenatal Vit-Fe Fumarate-FA (MULTIVITAMIN-PRENATAL) 27-0.8 MG TABS tablet Take 1 tablet by mouth daily at 12 noon.    . escitalopram (LEXAPRO) 10 MG tablet Take 1 tablet (10 mg total) by mouth daily. 90 tablet 3   Allergies  Allergen Reactions  . Progesterone Other (See Comments)    Patient stated that all four limbs went numb. She stated that she can take the artifical version of progesterone, she stated that she received it and was fine after.    ROS:  Review of Systems  Constitutional: Negative for chills, fatigue and fever.  Respiratory: Negative for shortness of breath.   Cardiovascular: Negative for chest pain.  Gastrointestinal: Positive for abdominal pain. Negative for nausea and vomiting.  Genitourinary: Positive for vaginal discharge. Negative for difficulty urinating, dysuria, flank pain, pelvic pain, vaginal bleeding and vaginal pain.  Musculoskeletal: Negative for back pain.  Neurological: Negative for dizziness and headaches.  Psychiatric/Behavioral: Negative.      I have reviewed patient's Past Medical Hx, Surgical Hx, Family Hx, Social Hx, medications and allergies.   Physical Exam   Patient Vitals for the past 24 hrs:  BP Temp Temp src Pulse Resp SpO2  08/18/20 1710 - - - - - 98 %  08/18/20 1705 - - - - - 99 %  08/18/20 1702 120/63 - - (!) 112 - -  08/18/20 1648 120/73 98.7 F (37.1  C) Oral (!) 117 17 100 %   Constitutional: Well-developed, well-nourished female in no acute distress.  Cardiovascular: normal rate Respiratory: normal effort GI: Abd soft, non-tender. Pos BS x 4 MS: Extremities nontender, no edema, normal ROM Neurologic: Alert and oriented x 4.  GU: Neg CVAT.  PELVIC EXAM: Cervix pink, visually closed and long, without lesion, scant white creamy discharge, no pooling of fluid with Valsalva, vaginal walls and external genitalia normal   FHT 151 by doppler  LAB RESULTS Results for  orders placed or performed during the hospital encounter of 08/18/20 (from the past 24 hour(s))  Urinalysis, Routine w reflex microscopic Urine, Clean Catch     Status: Abnormal   Collection Time: 08/18/20  4:42 PM  Result Value Ref Range   Color, Urine YELLOW YELLOW   APPearance CLEAR CLEAR   Specific Gravity, Urine <1.005 (L) 1.005 - 1.030   pH 6.0 5.0 - 8.0   Glucose, UA NEGATIVE NEGATIVE mg/dL   Hgb urine dipstick NEGATIVE NEGATIVE   Bilirubin Urine NEGATIVE NEGATIVE   Ketones, ur NEGATIVE NEGATIVE mg/dL   Protein, ur NEGATIVE NEGATIVE mg/dL   Nitrite NEGATIVE NEGATIVE   Leukocytes,Ua NEGATIVE NEGATIVE  Wet prep, genital     Status: Abnormal   Collection Time: 08/18/20  6:12 PM   Specimen: Genital  Result Value Ref Range   Yeast Wet Prep HPF POC NONE SEEN NONE SEEN   Trich, Wet Prep NONE SEEN NONE SEEN   Clue Cells Wet Prep HPF POC NONE SEEN NONE SEEN   WBC, Wet Prep HPF POC MANY (A) NONE SEEN   Sperm NONE SEEN   Amnisure rupture of membrane (rom)not at [facility]     Status: None   Collection Time: 08/18/20  7:04 PM  Result Value Ref Range   Amnisure ROM NEGATIVE        IMAGING No results found.  MAU Management/MDM: Orders Placed This Encounter  Procedures  . Wet prep, genital  . [place] MFM OB LIMITED  . Urinalysis, Routine w reflex microscopic  . Amnisure rupture of membrane (rom)not at [facility]  . Discharge patient    No orders of the defined types were placed in this encounter.   No pooling of fluid on pelvic exam, negative ferning on slide taken. Given report of 4 days of leaking fluid, obtained amnisure and Limited OB US.  Amnisure negative and Limited OB US with normal AFI. Cervical length 4.44cm on [place]. No evidence of ROM or of preterm labor D/C home Increase PO fluids, preterm labor precautions reviewed F/U in office as scheduled   ASSESSMENT 1. Encounter for suspected premature rupture of amniotic membranes, with rupture of membranes not found   2. Abdominal  pain during pregnancy in second trimester   3. Vaginal discharge during pregnancy, second trimester   4. [redacted] weeks gestation of pregnancy     PLAN Discharge home Allergies as of 08/18/2020      Reactions   Progesterone Other (See Comments)   Patient stated that all four limbs went numb. She stated that she can take the artifical version of progesterone, she stated that she received it and was fine after.      Medication List    TAKE these medications   escitalopram 10 MG tablet Commonly known as: Lexapro Take 1 tablet (10 mg total) by mouth daily.   multivitamin-prenatal 27-0.8 MG Tabs tablet Take 1 tablet by mouth daily at 12 noon.       Follow-up Information  [place] Follow up.   Why: As scheduled, return to MAU as needed for emergencies. Contact information: [address] [place] [address] [phone]               Sharen Counter Certified Nurse-Midwife 08/18/2020  7:41 PM

## 2020-08-19 DIAGNOSIS — Z3A15 15 weeks gestation of pregnancy: Secondary | ICD-10-CM | POA: Diagnosis not present

## 2020-08-19 DIAGNOSIS — O26892 Other specified pregnancy related conditions, second trimester: Secondary | ICD-10-CM

## 2020-08-19 DIAGNOSIS — Z3686 Encounter for antenatal screening for cervical length: Secondary | ICD-10-CM

## 2020-08-19 DIAGNOSIS — R102 Pelvic and perineal pain: Secondary | ICD-10-CM

## 2020-08-19 LAB — GC/CHLAMYDIA PROBE AMP (~~LOC~~) NOT AT ARMC
Chlamydia: NEGATIVE
Comment: NEGATIVE
Comment: NORMAL
Neisseria Gonorrhea: NEGATIVE

## 2020-09-01 ENCOUNTER — Inpatient Hospital Stay (HOSPITAL_COMMUNITY)
Admission: AD | Admit: 2020-09-01 | Discharge: 2020-09-01 | Disposition: A | Discharge status: Home / Self Care | Payer: BC Managed Care – PPO | Attending: Obstetrics and Gynecology | Admitting: Obstetrics and Gynecology

## 2020-09-01 ENCOUNTER — Encounter (HOSPITAL_COMMUNITY): Payer: Self-pay | Admitting: Obstetrics and Gynecology

## 2020-09-01 ENCOUNTER — Other Ambulatory Visit: Payer: Self-pay

## 2020-09-01 DIAGNOSIS — O99891 Other specified diseases and conditions complicating pregnancy: Secondary | ICD-10-CM

## 2020-09-01 DIAGNOSIS — O26892 Other specified pregnancy related conditions, second trimester: Secondary | ICD-10-CM | POA: Diagnosis present

## 2020-09-01 DIAGNOSIS — Z3A17 17 weeks gestation of pregnancy: Secondary | ICD-10-CM | POA: Diagnosis not present

## 2020-09-01 DIAGNOSIS — R030 Elevated blood-pressure reading, without diagnosis of hypertension: Secondary | ICD-10-CM | POA: Diagnosis not present

## 2020-09-01 DIAGNOSIS — R42 Dizziness and giddiness: Secondary | ICD-10-CM | POA: Diagnosis not present

## 2020-09-01 LAB — URINALYSIS, ROUTINE W REFLEX MICROSCOPIC
Bilirubin Urine: NEGATIVE
Glucose, UA: NEGATIVE mg/dL
Hgb urine dipstick: NEGATIVE
Ketones, ur: NEGATIVE mg/dL
Leukocytes,Ua: NEGATIVE
Nitrite: NEGATIVE
Protein, ur: NEGATIVE mg/dL
Specific Gravity, Urine: 1.01 (ref 1.005–1.030)
pH: 8 (ref 5.0–8.0)

## 2020-09-01 LAB — GLUCOSE, CAPILLARY: Glucose-Capillary: 72 mg/dL (ref 70–99)

## 2020-09-01 LAB — HEMOGLOBIN AND HEMATOCRIT, BLOOD
HCT: 34.3 % — ABNORMAL LOW (ref 36.0–46.0)
Hemoglobin: 11.8 g/dL — ABNORMAL LOW (ref 12.0–15.0)

## 2020-09-01 NOTE — Discharge Instructions (Signed)
Warning Signs During Pregnancy During pregnancy, your body goes through many changes. Some changes may be uncomfortable, but most do not represent a serious problem. However, it is important to learn when certain signs and symptoms may indicate a problem. Talk with your health care provider about your current health and any medical conditions you have. Make sure you know the symptoms to watch for and report. How does this affect me? Warning signs during pregnancy Let your health care provider know if you have any of the following warning signs:  Dizziness or feeling faint.  Nausea, vomiting, or diarrhea that lasts 24 hours or longer.  Spotting or bleeding from your vagina.  Abdominal cramping or pain in your pelvis or lower back.  Shortness of breath, difficulty breathing, or chest pain.  New or increased pain, swelling, or redness in an arm or leg.  Your baby is moving less than usual or is not moving. You should also watch for signs of a serious medical condition called preeclampsia. This may include:  A severe, throbbing headache that does not go away.  Vision changes, such as blurred or double vision, light sensitivity, or seeing spots in front of your eyes.  Sudden or extreme swelling of your face, hands, legs, or feet. Pregnancy causes changes that may make it more likely for you to get an infection. Let your health care provider know if you have signs of infection, such as:  A fever.  A bad-smelling vaginal discharge.  Pain or burning when you urinate. How does this affect my baby? Throughout your pregnancy, always report any of the warning signs of a problem to your health care provider. This can help prevent complications that may affect your baby, including:  Increased risk for premature birth.  Infection that may be transmitted to your baby.  Increased risk for stillbirth. Follow these instructions at home:  Take over-the-counter and prescription medicines only  as told by your health care provider.  Keep all follow-up visits. This is important.   Where to find more information  Office on Women's Health: womenshealth.gov/pregnancy/  American College of Obstetricians and Gynecologists: acog.org/womens-health/pregnancy Contact a health care provider if:  You have any warning signs of problems during your pregnancy.  Any of the following apply to you during your pregnancy: ? You have strong emotions, such as sadness or anxiety, that interfere with work or personal relationships. ? You feel unsafe in your home. ? You are using tobacco products, alcohol, or drugs, and you need help to stop. Get help right away if:  You have signs or symptoms of labor before 37 weeks of pregnancy. These include: ? Contractions that are 5 minutes or less apart, or that increase in frequency, intensity, or length. ? Sudden, sharp abdominal pain or low back pain. ? Uncontrolled gush or trickle of fluid from your vagina. Summary  Always report any warning signs to your health care provider to prevent complications that may affect both you and your baby.  Talk with your health care provider about your current health and any medical conditions you have. Make sure you know the symptoms to watch for and report.  Keep all follow-up visits. This is important. This information is not intended to replace advice given to you by your health care provider. Make sure you discuss any questions you have with your health care provider. Document Revised: 10/18/2019 Document Reviewed: 09/15/2019 Elsevier Patient Education  2021 [employer] Inc.  

## 2020-09-01 NOTE — MAU Note (Signed)
Pt reports to mau with c/o 1 elevated pressure in the middle of the night.  Pt states BP was 179/65 around midnight.  States she took it again 20 min later and it was 108/63.  Pt denies abd pain or vag bleeding today.

## 2020-09-01 NOTE — MAU Provider Note (Signed)
History     CSN: [id number]  Arrival date and time: 09/01/20 1056   Event Date/Time   First Provider Initiated Contact with Patient 09/01/20 1142      Chief Complaint  Patient presents with  . Hypertension   HPI Tara Hull is a 25 y.o. G1P0 at [redacted]w[redacted]d who presents for blood pressure evaluation. States she woke up at midnight & felt nauseated & dizzy; when she checked her blood pressure it was 179/65. She checked it 20 minutes later & it was 108/63. Reports normal BP this morning but called the office & was instructed to come to MAU. Denies history of hypertension. No headache, epigastric pain, abdominal pain, LOF, or vaginal bleeding. Reports continued feeling of lightheadedness & has had a few episodes of blurred vision (with & without her glasses on). Also reports continued nausea, no vomiting.   OB History    Gravida  1   Para      Term      Preterm      AB      Living        SAB      IAB      Ectopic      Multiple      Live Births              History reviewed. No pertinent past medical history.  Past Surgical History:  Procedure Laterality Date  . WISDOM TOOTH EXTRACTION  2015    Family History  Problem Relation Age of Onset  . Mental retardation Brother   . Other Brother        cleidocranial dysplasia    Social History   Tobacco Use  . Smoking status: Never Smoker  . Smokeless tobacco: Never Used  Substance Use Topics  . Alcohol use: No  . Drug use: No    Allergies:  Allergies  Allergen Reactions  . Progesterone Other (See Comments)    Patient stated that all four limbs went numb. She stated that she can take the artifical version of progesterone, she stated that she received it and was fine after.    No medications prior to admission.    Review of Systems  Constitutional: Negative.   Eyes: Positive for visual disturbance. Negative for photophobia.  Gastrointestinal: Positive for nausea. Negative for abdominal pain and  vomiting.  Genitourinary: Negative.   Neurological: Positive for dizziness and light-headedness. Negative for syncope and headaches.   Physical Exam   Blood pressure 93/62, pulse 85, temperature 98.5 F (36.9 C), temperature source Oral, resp. rate 18, SpO2 100 %.   Patient Vitals for the past 24 hrs:  BP Temp Temp src Pulse Resp SpO2  09/01/20 1336 93/62 -- -- -- -- --  09/01/20 1232 -- -- -- 85 -- --  09/01/20 1231 114/72 -- -- -- 18 --  09/01/20 1230 -- -- -- 90 -- --  09/01/20 1229 101/61 -- -- -- 18 --  09/01/20 1228 115/64 -- -- 94 18 100 %  09/01/20 1127 108/68 98.5 F (36.9 C) Oral (!) 112 18 100 %     Physical Exam Vitals and nursing note reviewed.  Constitutional:      General: She is not in acute distress.    Appearance: Normal appearance. She is normal weight.  HENT:     Head: Normocephalic and atraumatic.  Cardiovascular:     Rate and Rhythm: Normal rate and regular rhythm.     Heart sounds: Normal heart sounds.  Pulmonary:     Effort: Pulmonary effort is normal. No respiratory distress.     Breath sounds: Normal breath sounds. No wheezing.  Skin:    General: Skin is warm and dry.  Neurological:     Mental Status: She is alert.  Psychiatric:        Mood and Affect: Mood normal.        Behavior: Behavior normal.     MAU Course  Procedures Results for orders placed or performed during the hospital encounter of 09/01/20 (from the past 24 hour(s))  Urinalysis, Routine w reflex microscopic Urine, Clean Catch     Status: Abnormal   Collection Time: 09/01/20 12:04 PM  Result Value Ref Range   Color, Urine YELLOW YELLOW   APPearance CLOUDY (A) CLEAR   Specific Gravity, Urine 1.010 1.005 - 1.030   pH 8.0 5.0 - 8.0   Glucose, UA NEGATIVE NEGATIVE mg/dL   Hgb urine dipstick NEGATIVE NEGATIVE   Bilirubin Urine NEGATIVE NEGATIVE   Ketones, ur NEGATIVE NEGATIVE mg/dL   Protein, ur NEGATIVE NEGATIVE mg/dL   Nitrite NEGATIVE NEGATIVE   Leukocytes,Ua NEGATIVE  NEGATIVE  Hemoglobin and hematocrit, blood     Status: Abnormal   Collection Time: 09/01/20 12:08 PM  Result Value Ref Range   Hemoglobin 11.8 (L) 12.0 - 15.0 g/dL   HCT 90.2 (L) 40.9 - 73.5 %  Glucose, capillary     Status: None   Collection Time: 09/01/20 12:48 PM  Result Value Ref Range   Glucose-Capillary 72 70 - 99 mg/dL    MDM FHT present via doppler. Patient without abdominal pain or vaginal bleeding.   Reports isolated episode of hypertension on home cuff last night. She is normotensive in MAU & denies history of hypertension. Has continued to have some dizziness since then. Hemoglobin stable. CBG on low side but normal. U/a negative. Orthostatic vital signs normal.  Encouraged to increase water intake & eat every 2-3 hours.  Reviewed BP parameters & reasons to return to MAU  Assessment and Plan   1. Elevated BP without diagnosis of hypertension   2. Dizziness   3. [redacted] weeks gestation of pregnancy    -reviewed reasons to return -keep f/u with OB  Judeth Horn 09/01/2020, 2:37 PM

## 2020-10-12 ENCOUNTER — Other Ambulatory Visit: Payer: Self-pay

## 2020-10-12 ENCOUNTER — Inpatient Hospital Stay (HOSPITAL_COMMUNITY)
Admission: AD | Admit: 2020-10-12 | Discharge: 2020-10-12 | Disposition: A | Discharge status: Home / Self Care | Payer: BC Managed Care – PPO | Attending: Obstetrics & Gynecology | Admitting: Obstetrics & Gynecology

## 2020-10-12 ENCOUNTER — Encounter (HOSPITAL_COMMUNITY): Payer: Self-pay | Admitting: Obstetrics & Gynecology

## 2020-10-12 DIAGNOSIS — R079 Chest pain, unspecified: Secondary | ICD-10-CM | POA: Insufficient documentation

## 2020-10-12 DIAGNOSIS — O26891 Other specified pregnancy related conditions, first trimester: Secondary | ICD-10-CM

## 2020-10-12 DIAGNOSIS — Z79899 Other long term (current) drug therapy: Secondary | ICD-10-CM | POA: Diagnosis not present

## 2020-10-12 DIAGNOSIS — O99891 Other specified diseases and conditions complicating pregnancy: Secondary | ICD-10-CM | POA: Diagnosis not present

## 2020-10-12 DIAGNOSIS — O26892 Other specified pregnancy related conditions, second trimester: Secondary | ICD-10-CM | POA: Insufficient documentation

## 2020-10-12 DIAGNOSIS — Z3A23 23 weeks gestation of pregnancy: Secondary | ICD-10-CM | POA: Insufficient documentation

## 2020-10-12 DIAGNOSIS — R000 Tachycardia, unspecified: Secondary | ICD-10-CM | POA: Diagnosis not present

## 2020-10-12 DIAGNOSIS — R42 Dizziness and giddiness: Secondary | ICD-10-CM | POA: Insufficient documentation

## 2020-10-12 LAB — URINALYSIS, ROUTINE W REFLEX MICROSCOPIC
Bilirubin Urine: NEGATIVE
Glucose, UA: NEGATIVE mg/dL
Hgb urine dipstick: NEGATIVE
Ketones, ur: NEGATIVE mg/dL
Leukocytes,Ua: NEGATIVE
Nitrite: NEGATIVE
Protein, ur: NEGATIVE mg/dL
Specific Gravity, Urine: 1.004 — ABNORMAL LOW (ref 1.005–1.030)
pH: 7 (ref 5.0–8.0)

## 2020-10-12 NOTE — MAU Provider Note (Signed)
History     CSN: [id number]  Arrival date and time: 10/12/20 0054   Event Date/Time   First Provider Initiated Contact with Patient 10/12/20 0149      Chief Complaint  Patient presents with  . Chest Pain   Tara Hull is a 25 y.o. G1P0 at [redacted]w[redacted]d who receives care at [facility].  She presents today for Chest Pain.  She states the chest pain has resolved in the last 20 minutes.  She states she was working out and was unable to bring her heart rate back down.  She states once it did go down she was having some "residual chest pain."  She also reports some dizziness.  She endorses fetal movement.  She endorses some mild cramping upon arrival that has "since subsided."  She denies contractions. She reports she usually exercises, but did an "intense cardio workout."  She reports that she does not consistently work out.     OB History    Gravida  1   Para      Term      Preterm      AB      Living        SAB      IAB      Ectopic      Multiple      Live Births              History reviewed. No pertinent past medical history.  Past Surgical History:  Procedure Laterality Date  . WISDOM TOOTH EXTRACTION  2015    Family History  Problem Relation Age of Onset  . Mental retardation Brother   . Other Brother        cleidocranial dysplasia    Social History   Tobacco Use  . Smoking status: Never Smoker  . Smokeless tobacco: Never Used  Substance Use Topics  . Alcohol use: No  . Drug use: No    Allergies:  Allergies  Allergen Reactions  . Progesterone Other (See Comments)    Patient stated that all four limbs went numb. She stated that she can take the artifical version of progesterone, she stated that she received it and was fine after.    Medications Prior to Admission  Medication Sig Dispense Refill Last Dose  . loratadine (CLARITIN) 10 MG tablet Take 10 mg by mouth daily.   10/11/2020 at Unknown time  . Prenatal Vit-Fe Fumarate-FA  (MULTIVITAMIN-PRENATAL) 27-0.8 MG TABS tablet Take 1 tablet by mouth daily at 12 noon.   10/11/2020 at Unknown time  . escitalopram (LEXAPRO) 10 MG tablet Take 1 tablet (10 mg total) by mouth daily. 90 tablet 3     Review of Systems  Constitutional: Negative for chills and fever.  Eyes: Negative for visual disturbance.  Respiratory: Positive for shortness of breath (None currently). Negative for cough and chest tightness.   Cardiovascular: Positive for chest pain (None currently).  Genitourinary: Negative for difficulty urinating, dysuria, vaginal bleeding and vaginal discharge.  Neurological: Positive for dizziness. Negative for light-headedness and headaches.   Physical Exam   Blood pressure (!) 101/59, pulse 92, height 5\' 4"  (1.626 m), weight 61.7 kg, SpO2 99 %.  Physical Exam Constitutional:      Appearance: Normal appearance. She is well-developed.  HENT:     Head: Normocephalic and atraumatic.  Eyes:     Conjunctiva/sclera: Conjunctivae normal.  Cardiovascular:     Rate and Rhythm: Normal rate and regular rhythm.  Pulmonary:  Effort: Pulmonary effort is normal. No respiratory distress.     Breath sounds: Normal breath sounds.  Chest:     Chest wall: Tenderness present. No mass. Crepitus: Mild tenderness at sternum.  Abdominal:     General: Bowel sounds are normal.     Palpations: Abdomen is soft.     Tenderness: There is no abdominal tenderness.     Comments: Gravid  Musculoskeletal:        General: Normal range of motion.     Cervical back: Normal range of motion.  Skin:    General: Skin is warm and dry.  Neurological:     Mental Status: She is alert and oriented to person, place, and time.  Psychiatric:        Mood and Affect: Mood normal.        Thought Content: Thought content normal.     Fetal Assessment 145 bpm, Mod Var, -Decels, -Accels Toco: None graphed  MAU Course  No results found for this or any previous visit (from the past 24 hour(s)). No  results found.  MDM PE Labs: None EFM Consult for EKG read Assessment and Plan  25 year old G1P0  SIUP at 23.4weeks Cat I FT Chest Pain-Resolved   -Exam performed. -Instructed that she should not take on any high intensity exercises during pregnancy. -Reviewed need for proper hydration before, during, and after a work out. -Patient questions target heart rate to maintain during pregnancy and informed that dependent upon exercise, but no more than 140bpm. -Educated on how pregnancy puts extra work-load on heart. -Despite resolution of chest pain will perform EKG. -Patient agreeable and without questions or concerns.    Cherre Robins MSN, CNM 10/12/2020, 1:49 AM   Reassessment (2:16 AM) EKG reviewed by Dr. Melene Plan who reports normal findings. -Provider to bedside to discuss. -Informed that after research recommendation would be to keep heart rate between 140-160bpm. -However, encouraged to listen to body and not overexert self. -Patient questions how long she should wait to contact provider with onset of chest pain. -Instructed to contact provider for all chest pain for advice on how to proceed. -Patient verbalizes understanding and without further questions. -Encouraged to call or return to MAU if symptoms worsen or with the onset of new symptoms. -Discharged to home in stable condition.  Cherre Robins MSN, CNM Advanced Practice Provider, [facility]

## 2020-10-12 NOTE — MAU Note (Addendum)
I did a workout about 1845 and I could not get my heartrate down. Pulse was around 110-120. Shortly thereafter I started having chest pain and just did not feel well.  The chest pain comes and goes. It was there ago but I just feel like I cannot get over the workout. Occ abd pain. Denies LOF or VB

## 2020-10-12 NOTE — Discharge Instructions (Signed)
Exercise During Pregnancy Exercise is an important part of being healthy for people of all ages. Exercise improves the function of your heart and lungs and helps you maintain strength, flexibility, and a healthy body weight. Exercise also boosts energy levels and elevates mood. Most women should exercise regularly during pregnancy. Exercise routines may need to change as your pregnancy progresses. In rare cases, women with certain medical conditions or complications may be asked to limit or avoid exercise during pregnancy. Your health care provider will give you information on what will work for you. How does this affect me? Along with maintaining general strength and flexibility, exercising during pregnancy can help:  Keep strength in muscles that are used during labor and childbirth.  Decrease low back pain or symptoms of depression.  Control weight gain during pregnancy.  Reduce the risk of needing insulin if you develop diabetes during pregnancy.  Decrease the risk of cesarean delivery.  Speed up your recovery after giving birth.  Relieve constipation. How does this affect my baby? Exercise can help you have a healthy pregnancy. Exercise does not cause early (premature) birth. It will not cause your baby to weigh less at birth. What exercises can I do? Many exercises are safe for you to do during pregnancy. Do a variety of exercises that safely increase your heart and breathing rates and help you build and maintain muscle strength. Do exercises exactly as told by your health care provider. You may do these exercises:  Walking.  Swimming.  Water aerobics.  Riding a stationary bike.  Modified yoga or Pilates. Tell your instructor that you are pregnant. Avoid overstretching, and avoid lying on your back for long periods of time.  Running or jogging. Choose this type of exercise only if: ? You ran or jogged regularly before your pregnancy. ? You can run or jog and still talk in  complete sentences.   What exercises should I avoid? You may be told to limit high-intensity exercise depending on your level of fitness and whether you exercised regularly before you were pregnant. You can tell that you are exercising at a high intensity if you are breathing much harder and faster and cannot hold a conversation while exercising. You must avoid:  Contact sports.  Activities that put you at risk for falling on or being hit in the belly, such as downhill skiing, waterskiing, surfing, rock climbing, cycling, gymnastics, and horseback riding.  Scuba diving.  Skydiving.  Hot yoga or hot Pilates. These activities take place in a room that is heated to high temperatures.  Jogging or running, unless you jogged or ran regularly before you were pregnant. While jogging or running, you should always be able to talk in full sentences. Do not run or jog so fast that you are unable to have a conversation.  Do not exercise at more than 6,000 feet above sea level (high elevation) if you are not used to exercising at high elevation. How do I exercise in a safe way?  Avoid overheating. Do not exercise in very high temperatures.  Wear loose-fitting, breathable clothes.  Avoid dehydration. Drink enough fluid before, during, and after exercise to keep your urine pale yellow.  Avoid overstretching. Because of hormone changes during pregnancy, it is easy to overstretch muscles, tendons, and ligaments.  Start slowly and ask your health care provider to recommend the types of exercise that are safe for you.  Do not exercise to lose weight.  Wear a sports bra to support your breasts.    Avoid standing still or lying flat on your back as much as you can.   Follow these instructions at home:  Exercise on most days or all days of the week. Try to exercise for 30 minutes a day, 5 days a week, unless your health care provider tells you not to.  If you actively exercised before your pregnancy and  you are healthy, your health care provider may tell you to continue to do moderate-intensity to high-intensity exercise.  If you are just starting to exercise or did not exercise much before your pregnancy, your health care provider may tell you to do low-intensity to moderate-intensity exercise. Questions to ask your health care provider  Is exercise safe for me?  What are signs that I should stop exercising?  Does my health condition mean that I should not exercise during pregnancy?  When should I avoid exercising during pregnancy? Stop exercising and contact a health care provider if: You have any unusual symptoms such as:  Mild contractions of the uterus or cramps in the abdomen.  A dizzy feeling that does not go away when you rest. Stop exercising and get help right away if: You have any unusual symptoms such as:  Sudden, severe pain in your low back or your belly.  Regular, painful contractions of your uterus.  Chest pain.  Bleeding or fluid leaking from your vagina.  Shortness of breath.  Headache.  Pain and swelling of your calves. Summary  Most women should exercise regularly throughout pregnancy. In rare cases, women with certain medical conditions or complications may be asked to limit or avoid exercise during pregnancy.  Do not exercise to lose weight during pregnancy.  Your health care provider will tell you what level of physical activity is right for you.  Stop exercising and contact a health care provider if you have unusual symptoms, such as mild contractions or dizziness. This information is not intended to replace advice given to you by your health care provider. Make sure you discuss any questions you have with your health care provider. Document Revised: 12/27/2019 Document Reviewed: 12/27/2019 Elsevier Patient Education  2021 [employer]  

## 2020-10-13 ENCOUNTER — Inpatient Hospital Stay (HOSPITAL_COMMUNITY)
Admission: AD | Admit: 2020-10-13 | Discharge: 2020-10-13 | Disposition: A | Discharge status: Home / Self Care | Payer: BC Managed Care – PPO | Attending: Obstetrics & Gynecology | Admitting: Obstetrics & Gynecology

## 2020-10-13 ENCOUNTER — Other Ambulatory Visit: Payer: Self-pay

## 2020-10-13 DIAGNOSIS — O99891 Other specified diseases and conditions complicating pregnancy: Secondary | ICD-10-CM

## 2020-10-13 DIAGNOSIS — R000 Tachycardia, unspecified: Secondary | ICD-10-CM

## 2020-10-13 DIAGNOSIS — Z3A23 23 weeks gestation of pregnancy: Secondary | ICD-10-CM

## 2020-10-13 NOTE — Discharge Instructions (Signed)

## 2020-10-13 NOTE — MAU Provider Note (Signed)
History     CSN: [id number]  Arrival date and time: 10/13/20 1338   Event Date/Time   First Provider Initiated Contact with Patient 10/13/20 1417      Chief Complaint  Patient presents with  . Tachycardia   HPI  Tara Hull is a 25 y.o. G1P0 at [redacted]w[redacted]d who presents stating her heart rate is high. She is reporting the same complaints that she had in MAU 24 hours ago. She reports she called the on call nurse and was told to come back in. She denies any chest pain at this time. She denies any abdominal pain, vaginal bleeding or discharge. She reports normal fetal movement.   OB History    Gravida  1   Para      Term      Preterm      AB      Living        SAB      IAB      Ectopic      Multiple      Live Births              No past medical history on file.  Past Surgical History:  Procedure Laterality Date  . WISDOM TOOTH EXTRACTION  2015    Family History  Problem Relation Age of Onset  . Mental retardation Brother   . Other Brother        cleidocranial dysplasia    Social History   Tobacco Use  . Smoking status: Never Smoker  . Smokeless tobacco: Never Used  Substance Use Topics  . Alcohol use: No  . Drug use: No    Allergies:  Allergies  Allergen Reactions  . Progesterone Other (See Comments)    Patient stated that all four limbs went numb. She stated that she can take the artifical version of progesterone, she stated that she received it and was fine after.    Medications Prior to Admission  Medication Sig Dispense Refill Last Dose  . escitalopram (LEXAPRO) 10 MG tablet Take 1 tablet (10 mg total) by mouth daily. 90 tablet 3   . loratadine (CLARITIN) 10 MG tablet Take 10 mg by mouth daily.     . Prenatal Vit-Fe Fumarate-FA (MULTIVITAMIN-PRENATAL) 27-0.8 MG TABS tablet Take 1 tablet by mouth daily at 12 noon.       Review of Systems  Constitutional: Negative.  Negative for fatigue and fever.  HENT: Negative.    Respiratory: Negative.  Negative for shortness of breath.   Cardiovascular: Negative.  Negative for chest pain.  Gastrointestinal: Negative.  Negative for abdominal pain, constipation, diarrhea, nausea and vomiting.  Genitourinary: Negative.  Negative for dysuria.  Neurological: Negative.  Negative for dizziness and headaches.   Physical Exam   Blood pressure 110/68, pulse (!) 104, temperature 98 F (36.7 C), temperature source Oral, resp. rate 17.   Physical Exam Vitals and nursing note reviewed.  Constitutional:      General: She is not in acute distress.    Appearance: She is well-developed.  HENT:     Head: Normocephalic.  Eyes:     Pupils: Pupils are equal, round, and reactive to light.  Cardiovascular:     Rate and Rhythm: Normal rate and regular rhythm.     Heart sounds: Normal heart sounds.  Pulmonary:     Effort: Pulmonary effort is normal. No respiratory distress.     Breath sounds: Normal breath sounds.  Abdominal:     General:  Bowel sounds are normal. There is no distension.     Palpations: Abdomen is soft.     Tenderness: There is no abdominal tenderness.  Skin:    General: Skin is warm and dry.  Neurological:     Mental Status: She is alert and oriented to person, place, and time.  Psychiatric:        Behavior: Behavior normal.        Thought Content: Thought content normal.        Judgment: Judgment normal.     Fetal Tracing:  Baseline: 150 Variability: moderate Accels: none Decels: none  Toco: none   MAU Course  Procedures  MDM NST reassuring for gestational age Vital signs stable and patient denies any chest pain at this time.  Patient adamant that she wants to be seen by MD from [facility]. Explained how MAU works and that MDs are not in house available to see patients at all times. CNM called Dr. Langston Masker and reviewed chart and complaints as well as request to be seen by MD. Dr. Langston Masker reviewed chart and previous work up 24 hours  ago and agrees with plan of care. Patient informed of MD review and agreement with outpatient follow up but will not be in MAU to assess patient.   Patient without complaints at this time. Will call OB office in am to be seen this week. Outpatient cardiology referral made.   Assessment and Plan   1. [redacted] weeks gestation of pregnancy   2. Tachycardia    -Discharge home in stable condition -Second trimester precautions discussed -Patient advised to follow-up with OB this week -Patient may return to MAU as needed or if her condition were to change or worsen   Rolm Bookbinder CNM 10/13/2020, 2:17 PM

## 2020-10-13 NOTE — MAU Note (Addendum)
Pt presents today with c/o elevated HR at rest with dizziness chest pain that comes and goes, mostly related to working out that lingers for hours.  The same symptoms occurred yesterday and she was told to come back if it happened again. Good FM, no LOF, no VB.  Would like to be evaluated by MD from [facility].  COVID infection x2 last in January 2022

## 2020-10-15 ENCOUNTER — Other Ambulatory Visit: Payer: Self-pay

## 2020-10-15 ENCOUNTER — Encounter: Payer: Self-pay | Admitting: Cardiovascular Disease

## 2020-10-15 ENCOUNTER — Ambulatory Visit (INDEPENDENT_AMBULATORY_CARE_PROVIDER_SITE_OTHER): Payer: BC Managed Care – PPO | Admitting: Cardiovascular Disease

## 2020-10-15 DIAGNOSIS — Z3A24 24 weeks gestation of pregnancy: Secondary | ICD-10-CM

## 2020-10-15 DIAGNOSIS — R0789 Other chest pain: Secondary | ICD-10-CM

## 2020-10-15 DIAGNOSIS — R000 Tachycardia, unspecified: Secondary | ICD-10-CM

## 2020-10-15 DIAGNOSIS — R011 Cardiac murmur, unspecified: Secondary | ICD-10-CM | POA: Diagnosis not present

## 2020-10-15 NOTE — Progress Notes (Signed)
Cardiology Office Note    Date:  10/21/2020   ID:  Tara Hull, DOB Jan 02, 1996, MRN [id number]  PCP:  Patient, No Pcp Per (Inactive)  Cardiologist:  Nicki Guadalajara, MD   New cardiology evaluation, referred through the courtesy of Levell July, CNM from MAU.  History of Present Illness:  Tara Hull is a 25 y.o. female who is pregnant with her first child, currently approximately 24 weeks; due date February 04, 2021.  She has recently been in evaluated by OB/GYN in the MAU.  In April she presented with blood pressure elevation after she had awakened at midnight and felt nauseated and dizzy.  Initial blood pressure was 179/65 and 20 minutes later was 103/63.  At that time she was instructed to be evaluated at MAU and during that evaluation he was stable and in fact her blood pressure was 93/62 at presentation.  She was recently reevaluated MAU on Oct 12, 2020 when she had experienced atypical chest pain and also sensation of increased heart rate following an intense cardiac workout.  Typically she exercises regularly and does lunges and squats and states her heart rate typically before her pregnancy was in the 70s and more recently is in the 80s to 90s.  When she was exercising this past weekend she had had some chest pain and her heart rate had increased to the 120s to 130s.  She was evaluated by Bunnie Domino, CNM at [facility] and she was instructed not to take on any high intensity exercises during her pregnancy.  It was recommended for her to continue proper hydration before, during and after her workout.  An ECG reportedly was done which was normal.  Following day the patient was evaluated by Iven Finn, CNM after she was called by the nurse and instructed to come back to get reexamined.  Vital signs were stable and she was not having any recurrent chest pain.  Presently, she feels well.  She denies any recurrent chest pain.  She has gained approximately 14 pounds since the  beginning of her pregnancy.  She presents for evaluation.   She had had COVID on 2 occasions initially in January 2021 and more recently in January 2022.  She had received Pfizer vaccinations and booster  Past Surgical History:  Procedure Laterality Date  . WISDOM TOOTH EXTRACTION  2015    Current Medications: Outpatient Medications Prior to Visit  Medication Sig Dispense Refill  . escitalopram (LEXAPRO) 10 MG tablet Take 1 tablet (10 mg total) by mouth daily. 90 tablet 3  . Prenatal Vit-Fe Fumarate-FA (MULTIVITAMIN-PRENATAL) 27-0.8 MG TABS tablet Take 1 tablet by mouth daily at 12 noon.    . loratadine (CLARITIN) 10 MG tablet Take 10 mg by mouth daily.     No facility-administered medications prior to visit.     Allergies:   Progesterone   Social History   Socioeconomic History  . Marital status: Married    Spouse name: Not on file  . Number of children: Not on file  . Years of education: Not on file  . Highest education level: Not on file  Occupational History  . Not on file  Tobacco Use  . Smoking status: Never Smoker  . Smokeless tobacco: Never Used  Substance and Sexual Activity  . Alcohol use: No  . Drug use: No  . Sexual activity: Never  Other Topics Concern  . 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  She works at [employer]. Lives with her parents and younger brothers.   HC: 52.9 cm   PHQ-9 Total Score: 12   SCARED Total Score: 12   Social Determinants of Health   Financial Resource Strain: Not on file  Food Insecurity: Not on file  Transportation Needs: Not on file  Physical Activity: Not on file  Stress: Not on file  Social Connections: Not on file    Socially she was born in [place].  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  She works part-time from home for [employer].  There is no tobacco history.  She exercises regularly.  Family History:  The  patient's family history includes Mental retardation in her brother; Other in her brother.   ROS General: Negative; No fevers, chills, or night sweats;  HEENT: Negative; No changes in vision or hearing, sinus congestion, difficulty swallowing Pulmonary: Negative; No cough, wheezing, shortness of breath, hemoptysis Cardiovascular:  GI: Negative; No nausea, vomiting, diarrhea, or abdominal pain GU: Negative; No dysuria, hematuria, or difficulty voiding Musculoskeletal: Negative; no myalgias, joint pain, or weakness Hematologic/Oncology: Negative; no easy bruising, bleeding Endocrine: Negative; no heat/cold intolerance; no diabetes Neuro: Negative; no changes in balance, headaches Skin: Negative; No rashes or skin lesions Psychiatric: Negative; No behavioral problems, depression Sleep: Negative; No snoring, daytime sleepiness, hypersomnolence, bruxism, restless legs, hypnogognic hallucinations, no cataplexy Other comprehensive 14 point system review is negative.   PHYSICAL EXAM:   VS:  BP (!) 100/50 (BP Location: Left Arm)   Pulse 99   Ht 5\' 4"  (1.626 m)   Wt 137 lb (62.1 kg)   BMI 23.52 kg/m     Repeat blood pressure by me was 120/66 supine and 110/60 standing.  Wt Readings from Last 3 Encounters:  10/15/20 137 lb (62.1 kg)  10/12/20 136 lb (61.7 kg)  12/21/19 122 lb (55.3 kg)    General: Alert, oriented, no distress.  Skin: normal turgor, no rashes, warm and dry HEENT: Normocephalic, atraumatic. Pupils equal round and reactive to light; sclera anicteric; extraocular muscles intact;  Nose without nasal septal hypertrophy Mouth/Parynx benign; Mallinpatti scale 2 Neck: No JVD, no carotid bruits; normal carotid upstroke Lungs: clear to ausculatation and percussion; no wheezing or rales Chest wall: without tenderness to palpitation Heart: PMI not displaced, RRR, s1 s2 normal, 1-2/6 systolic murmur, no diastolic murmur, no rubs, gallops, thrills, or heaves Abdomen: soft,  nontender; no hepatosplenomehaly, BS+; abdominal aorta nontender and not dilated by palpation. Back: no CVA tenderness Pulses 2+ Musculoskeletal: full range of motion, normal strength, no joint deformities Extremities: no clubbing cyanosis or edema, Homan's sign negative  Neurologic: grossly nonfocal; Cranial nerves grossly wnl Psychologic: Normal mood and affect   Studies/Labs Reviewed:   EKG:  EKG is ordered today.  ECG (independently read by me): Sinus rhythm at 99 with sinus arrhyhmia; normal intervals  Recent Labs: BMP Latest Ref Rng & Units 12/21/2019  Glucose 70 - 99 mg/dL 12/23/2019)  BUN 6 - 20 mg/dL 15  Creatinine 786(V - 6.72 mg/dL 0.94  Sodium 7.09 - 628 mmol/L 139  Potassium 3.5 - 5.1 mmol/L 3.5  Chloride 98 - 111 mmol/L 103  CO2 22 - 32 mmol/L 27  Calcium 8.9 - 10.3 mg/dL 9.6     No flowsheet data found.  CBC Latest Ref Rng & Units 09/01/2020 12/21/2019  WBC 4.0 - 10.5 K/uL - 7.6  Hemoglobin 12.0 - 15.0 g/dL 11.8(L) 14.0  Hematocrit 36.0 - 46.0 %  34.3(L) 42.2  Platelets 150 - 400 K/uL - 258   Lab Results  Component Value Date   MCV 88.1 12/21/2019   No results found for: TSH No results found for: HGBA1C   BNP No results found for: BNP  ProBNP No results found for: PROBNP   Lipid Panel  No results found for: CHOL, TRIG, HDL, CHOLHDL, VLDL, LDLCALC, LDLDIRECT, LABVLDL   RADIOLOGY: No results found.   Additional studies/ records that were reviewed today include:  I reviewed the records of OB/GYN and her MAU provider evaluations   ASSESSMENT:    1. Murmur   2. Sinus tachycardia   3. Atypical chest pain   4. [redacted] weeks gestation of pregnancy     PLAN:  Tara Hull is a very pleasant 25 year old female who is [redacted] weeks pregnant with her first child.  She admits to excellent health and prior to her pregnancy resting heart rate is typically withdrawn in the 70s.  She has tried to stay active during her pregnancy and often works out.  Following  recent more intense exercise she had noticed some blood pressure elevation and some atypical chest pain.  She she has been evaluated in April 2022 with blood pressure elevation with promptly normalized and more recently on May 21 and Oct 13, 2020.  Presently, she feels well.  I reviewed her ECG from her prior evaluation which had shown sinus rhythm at 86 bpm..  Her ECG today shows sinus rhythm at 99 bpm without ST-T abnormalities.  She is complaining her second trimester of pregnancy and I discussed the increased blood volume associated with this trimester.  She does have a cardiac murmur which I suspect is related to this increased blood volume.  I have suggested that he undergo a 2D echo Doppler evaluation to make certain there is no abnormal structural heart disease.  I discussed that she should try to avoid high intensity workouts during her remaining pregnancy but continue to walk and have low intensity exercise.  I will contact her regarding her echocardiographic results.  I suspect she is stable from a cardiac standpoint but should continue to monitor her blood pressure and heart rate closely.  I will be available to see her if problems arise from a cardiac standpoint prior to her delivery.   Medication Adjustments/Labs and Tests Ordered: Current medicines are reviewed at length with the patient today.  Concerns regarding medicines are outlined above.  Medication changes, Labs and Tests ordered today are listed in the Patient Instructions below. Patient Instructions  Medication Instructions:  Your Physician recommend you continue on your current medication as directed.    *If you need a refill on your cardiac medications before your next appointment, please call your pharmacy*   Lab Work: None   Testing/Procedures: Your physician has requested that you have an echocardiogram. Echocardiography is a painless test that uses sound waves to create images of your heart. It provides your doctor with  information about the size and shape of your heart and how well your heart's chambers and valves are working. This procedure takes approximately one hour. There are no restrictions for this procedure. [address]    Follow-Up: At [facility], you and your health needs are our priority.  As part of our continuing mission to provide you with exceptional heart care, we have created designated Provider Care Teams.  These Care Teams include your primary Cardiologist (physician) and Advanced Practice Providers (APPs -  Physician  Assistants and Nurse Practitioners) who all work together to provide you with the care you need, when you need it.  We recommend signing up for the patient portal called "MyChart".  Sign up information is provided on this After Visit Summary.  MyChart is used to connect with patients for Virtual Visits (Telemedicine).  Patients are able to view lab/test results, encounter notes, upcoming appointments, etc.  Non-urgent messages can be sent to your provider as well.   To learn more about what you can do with MyChart, go to [email or web address].    Your next appointment:   As needed   The format for your next appointment:   In Person  Provider:   Nicki Guadalajara, MD       Signed, Nicki Guadalajara, MD  10/21/2020 3:50 PM    [facility] [address], [place], [place]  [address] Phone: [phone]

## 2020-10-15 NOTE — Patient Instructions (Signed)
Medication Instructions:  Your Physician recommend you continue on your current medication as directed.    *If you need a refill on your cardiac medications before your next appointment, please call your pharmacy*   Lab Work: None   Testing/Procedures: Your physician has requested that you have an echocardiogram. Echocardiography is a painless test that uses sound waves to create images of your heart. It provides your doctor with information about the size and shape of your heart and how well your heart's chambers and valves are working. This procedure takes approximately one hour. There are no restrictions for this procedure. [address]    Follow-Up: At [facility], you and your health needs are our priority.  As part of our continuing mission to provide you with exceptional heart care, we have created designated Provider Care Teams.  These Care Teams include your primary Cardiologist (physician) and Advanced Practice Providers (APPs -  Physician Assistants and Nurse Practitioners) who all work together to provide you with the care you need, when you need it.  We recommend signing up for the patient portal called "MyChart".  Sign up information is provided on this After Visit Summary.  MyChart is used to connect with patients for Virtual Visits (Telemedicine).  Patients are able to view lab/test results, encounter notes, upcoming appointments, etc.  Non-urgent messages can be sent to your provider as well.   To learn more about what you can do with MyChart, go to [email or web address].    Your next appointment:   As needed   The format for your next appointment:   In Person  Provider:   Nicki Guadalajara, MD

## 2020-10-17 DIAGNOSIS — R1310 Dysphagia, unspecified: Secondary | ICD-10-CM | POA: Insufficient documentation

## 2020-10-21 ENCOUNTER — Encounter: Payer: Self-pay | Admitting: Cardiovascular Disease

## 2020-11-12 ENCOUNTER — Other Ambulatory Visit (HOSPITAL_COMMUNITY): Payer: BC Managed Care – PPO

## 2020-11-14 ENCOUNTER — Ambulatory Visit (HOSPITAL_COMMUNITY): Payer: BC Managed Care – PPO | Attending: Cardiology

## 2020-11-14 ENCOUNTER — Other Ambulatory Visit: Payer: Self-pay

## 2020-11-14 DIAGNOSIS — R011 Cardiac murmur, unspecified: Secondary | ICD-10-CM | POA: Diagnosis present

## 2020-11-14 LAB — ECHOCARDIOGRAM COMPLETE
Area-P 1/2: 4.71 cm2
S' Lateral: 3 cm

## 2020-11-20 ENCOUNTER — Telehealth: Payer: Self-pay | Admitting: Cardiovascular Disease

## 2020-11-20 NOTE — Telephone Encounter (Signed)
New Message:     Pt says her Echo was normal. She wants to know if she needs a follow up visit?

## 2020-11-20 NOTE — Telephone Encounter (Signed)
Returned call to patient-advised echo was normal per Dr. Tresa Endo. Ok to follow up PRN as planned.

## 2021-01-26 ENCOUNTER — Inpatient Hospital Stay (HOSPITAL_COMMUNITY)
Admission: AD | Admit: 2021-01-26 | Discharge: 2021-01-26 | Disposition: A | Discharge status: Home / Self Care | Payer: BC Managed Care – PPO | Attending: Obstetrics and Gynecology | Admitting: Obstetrics and Gynecology

## 2021-01-26 ENCOUNTER — Other Ambulatory Visit: Payer: Self-pay

## 2021-01-26 ENCOUNTER — Encounter (HOSPITAL_COMMUNITY): Payer: Self-pay | Admitting: Obstetrics and Gynecology

## 2021-01-26 DIAGNOSIS — O26893 Other specified pregnancy related conditions, third trimester: Secondary | ICD-10-CM | POA: Insufficient documentation

## 2021-01-26 DIAGNOSIS — Z79899 Other long term (current) drug therapy: Secondary | ICD-10-CM | POA: Insufficient documentation

## 2021-01-26 DIAGNOSIS — O368120 Decreased fetal movements, second trimester, not applicable or unspecified: Secondary | ICD-10-CM

## 2021-01-26 DIAGNOSIS — Z3689 Encounter for other specified antenatal screening: Secondary | ICD-10-CM | POA: Diagnosis not present

## 2021-01-26 DIAGNOSIS — O368130 Decreased fetal movements, third trimester, not applicable or unspecified: Secondary | ICD-10-CM | POA: Diagnosis present

## 2021-01-26 DIAGNOSIS — Z3A38 38 weeks gestation of pregnancy: Secondary | ICD-10-CM

## 2021-01-26 DIAGNOSIS — M549 Dorsalgia, unspecified: Secondary | ICD-10-CM | POA: Diagnosis not present

## 2021-01-26 DIAGNOSIS — M546 Pain in thoracic spine: Secondary | ICD-10-CM

## 2021-01-26 HISTORY — DX: Anxiety disorder, unspecified: F41.9

## 2021-01-26 LAB — URINALYSIS, ROUTINE W REFLEX MICROSCOPIC
Bilirubin Urine: NEGATIVE
Glucose, UA: NEGATIVE mg/dL
Hgb urine dipstick: NEGATIVE
Ketones, ur: NEGATIVE mg/dL
Leukocytes,Ua: NEGATIVE
Nitrite: NEGATIVE
Protein, ur: NEGATIVE mg/dL
Specific Gravity, Urine: 1.002 — ABNORMAL LOW (ref 1.005–1.030)
pH: 7 (ref 5.0–8.0)

## 2021-01-26 NOTE — MAU Note (Signed)
Pt reports that since last night, she's had decreased fetal movement. She also reports back pain that occurred after her baby "got stuck on the right side." She was attempting to get baby to move and now she's having mid-back pain and pain behind her ribs. She denies contractions, VB or LOF.

## 2021-01-26 NOTE — MAU Provider Note (Signed)
History     CSN: [id number]  Arrival date and time: 01/26/21 1711   Event Date/Time   First Provider Initiated Contact with Patient 01/26/21 1824      Chief Complaint  Patient presents with   Decreased Fetal Movement   Back Pain   Tara Hull is a 25 y.o. G1P0 at [redacted]w[redacted]d who receives care at [facility].  She presents today for Decreased Fetal Movement and Back Pain.  She states she has felt baby move, but it "just isn't as frequent as it usually was."  She states she also has back pain in her "middle to upper back."  She reports it started last night after "pushing him back in" when referencing moving the baby "getting stuck on the right side."  Patient reports this occurred around 0300 and has been intermittent.  She describes it as a "soreness" and has not found any relieving factors despite icing, but has not noticed any worsening pain.  She rates the pain a 3/10 and denies need for further management.  She denies abdominal cramping or contractions. She reports she has been "leaking fluid" throughout the pregnancy and has been monitored by her provider. She denies vaginal odor or increase in fluid, but noted increase in vaginal discharge.    OB History     Gravida  1   Para      Term      Preterm      AB      Living         SAB      IAB      Ectopic      Multiple      Live Births              Past Medical History:  Diagnosis Date   Anxiety     Past Surgical History:  Procedure Laterality Date   WISDOM TOOTH EXTRACTION  2015    Family History  Problem Relation Age of Onset   Mental retardation Brother    Other Brother        cleidocranial dysplasia    Social History   Tobacco Use   Smoking status: Never   Smokeless tobacco: Never  Substance Use Topics   Alcohol use: No   Drug use: No    Allergies:  Allergies  Allergen Reactions   Progesterone Other (See Comments)    Patient stated that all four limbs went numb. She  stated that she can take the artifical version of progesterone, she stated that she received it and was fine after.    Medications Prior to Admission  Medication Sig Dispense Refill Last Dose   escitalopram (LEXAPRO) 10 MG tablet Take 1 tablet (10 mg total) by mouth daily. 90 tablet 3 01/26/2021   Multiple Vitamin (MULTIVITAMIN) tablet Take 1 tablet by mouth daily.      Prenatal Vit-Fe Fumarate-FA (MULTIVITAMIN-PRENATAL) 27-0.8 MG TABS tablet Take 1 tablet by mouth daily at 12 noon.   01/26/2021    Review of Systems  Gastrointestinal:  Negative for abdominal pain.  Genitourinary:  Negative for difficulty urinating, dysuria, vaginal bleeding and vaginal discharge.  Musculoskeletal:  Positive for back pain (Middle and Upper Back).  Physical Exam   Blood pressure 118/71, pulse 79, temperature 99.3 F (37.4 C), temperature source Oral, resp. rate 16, height 5\' 4"  (1.626 m), weight 65.4 kg, SpO2 98 %.  Physical Exam Constitutional:      Appearance: Normal appearance.  HENT:  Head: Normocephalic and atraumatic.  Eyes:     Conjunctiva/sclera: Conjunctivae normal.  Cardiovascular:     Rate and Rhythm: Normal rate and regular rhythm.  Pulmonary:     Effort: Pulmonary effort is normal. No respiratory distress.     Breath sounds: Normal breath sounds.  Abdominal:     Tenderness: There is no abdominal tenderness.  Musculoskeletal:     Cervical back: Normal range of motion.     Thoracic back: Bony tenderness present.     Comments: Bony tenderness below scapulas near spine on right and left side.  No apparent bruising, swelling or edema.   Skin:    General: Skin is warm and dry.  Neurological:     Mental Status: She is alert and oriented to person, place, and time.  Psychiatric:        Mood and Affect: Mood normal.        Behavior: Behavior normal.        Thought Content: Thought content normal.    Fetal Assessment 140 bpm, Mod Var, -Decels, +Accels Toco: Irritability, No Ctx  graphed  MAU Course   Results for orders placed or performed during the hospital encounter of 01/26/21 (from the past 24 hour(s))  Urinalysis, Routine w reflex microscopic Urine, Clean Catch     Status: Abnormal   Collection Time: 01/26/21  6:27 PM  Result Value Ref Range   Color, Urine STRAW (A) YELLOW   APPearance CLEAR CLEAR   Specific Gravity, Urine 1.002 (L) 1.005 - 1.030   pH 7.0 5.0 - 8.0   Glucose, UA NEGATIVE NEGATIVE mg/dL   Hgb urine dipstick NEGATIVE NEGATIVE   Bilirubin Urine NEGATIVE NEGATIVE   Ketones, ur NEGATIVE NEGATIVE mg/dL   Protein, ur NEGATIVE NEGATIVE mg/dL   Nitrite NEGATIVE NEGATIVE   Leukocytes,Ua NEGATIVE NEGATIVE   No results found.  MDM PE Labs: None EFM Heating Pad Assessment and Plan  25 year old G1P0  SIUP at 38.5 weeks Cat I FT DFM Back Pain/Tenderness  -POC Reviewed -Exam performed. -Reviewed usage of heat/cold for back discomfort. -Will give heating pad now -Patient offered and declines pain medication. -Reassured that NST is reactive. -Informed that provider will continue to monitor and discharge if movement remains notable. -Patient verbalizes understanding and without questions or concerns.   Cherre Robins MSN, CNM 01/26/2021, 6:24 PM   Reassessment (7:10 PM)  -Nurse states patient reports continued fetal movement and improvement of back pain with heat application. -NST remains reactive.  -Encouraged to call primary office or return to MAU if symptoms worsen or with the onset of new symptoms. -Discharged to home in stable condition.  Cherre Robins MSN, CNM Advanced Practice Provider, [facility]

## 2021-01-30 ENCOUNTER — Encounter (HOSPITAL_COMMUNITY): Payer: Self-pay | Admitting: *Deleted

## 2021-01-30 ENCOUNTER — Encounter (HOSPITAL_COMMUNITY): Payer: Self-pay | Admitting: Obstetrics and Gynecology

## 2021-01-30 ENCOUNTER — Inpatient Hospital Stay (EMERGENCY_DEPARTMENT_HOSPITAL)
Admission: AD | Admit: 2021-01-30 | Discharge: 2021-01-30 | Disposition: A | Discharge status: Home / Self Care | Payer: BC Managed Care – PPO | Source: Home / Self Care | Attending: Obstetrics and Gynecology | Admitting: Obstetrics and Gynecology

## 2021-01-30 ENCOUNTER — Other Ambulatory Visit: Payer: Self-pay

## 2021-01-30 ENCOUNTER — Telehealth (HOSPITAL_COMMUNITY): Payer: Self-pay | Admitting: *Deleted

## 2021-01-30 ENCOUNTER — Inpatient Hospital Stay (HOSPITAL_BASED_OUTPATIENT_CLINIC_OR_DEPARTMENT_OTHER): Payer: BC Managed Care – PPO

## 2021-01-30 ENCOUNTER — Encounter (HOSPITAL_COMMUNITY): Payer: Self-pay

## 2021-01-30 DIAGNOSIS — Z3A39 39 weeks gestation of pregnancy: Secondary | ICD-10-CM | POA: Diagnosis not present

## 2021-01-30 DIAGNOSIS — Z3689 Encounter for other specified antenatal screening: Secondary | ICD-10-CM

## 2021-01-30 DIAGNOSIS — O26893 Other specified pregnancy related conditions, third trimester: Secondary | ICD-10-CM | POA: Diagnosis not present

## 2021-01-30 DIAGNOSIS — O368130 Decreased fetal movements, third trimester, not applicable or unspecified: Secondary | ICD-10-CM

## 2021-01-30 DIAGNOSIS — M4140 Neuromuscular scoliosis, site unspecified: Secondary | ICD-10-CM | POA: Insufficient documentation

## 2021-01-30 NOTE — Telephone Encounter (Signed)
Preadmission screen  

## 2021-01-30 NOTE — MAU Provider Note (Signed)
History     CSN: [id number]  Arrival date and time: 01/30/21 1716   Event Date/Time   First Provider Initiated Contact with Patient 01/30/21 1800      Chief Complaint  Patient presents with   Decreased Fetal Movement   HPI LADY WISHAM is a 25 y.o. G1P0 at [redacted]w[redacted]d who presents for decreased fetal movement. Reports twice in the last week she has felt like baby got "stuck" in the right side of her abdomen. When this occurs she does not feel fetal movement for a few hours afterwards. Last occurred earlier today. Since then has had return of movement. States she spoke with the triage nurse with [facility] & was instructed to come here for an ultrasound.  Denies contractions, LOF, or vaginal bleeding. Currently reports normal fetal movement.   OB History     Gravida  1   Para      Term      Preterm      AB      Living         SAB      IAB      Ectopic      Multiple      Live Births              Past Medical History:  Diagnosis Date   Anxiety    Heart murmur     Past Surgical History:  Procedure Laterality Date   WISDOM TOOTH EXTRACTION  2015    Family History  Problem Relation Age of Onset   Mental retardation Brother    Other Brother        cleidocranial dysplasia    Social History   Tobacco Use   Smoking status: Never   Smokeless tobacco: Never  Vaping Use   Vaping Use: Never used  Substance Use Topics   Alcohol use: No   Drug use: No    Allergies:  Allergies  Allergen Reactions   Progesterone Other (See Comments)    Patient stated that all four limbs went numb. She stated that she can take the artifical version of progesterone, she stated that she received it and was fine after.    No medications prior to admission.    Review of Systems  Constitutional: Negative.   Gastrointestinal: Negative.   Genitourinary: Negative.   Physical Exam   Blood pressure (!) 99/39, pulse 93, temperature 98.3 F (36.8 C),  temperature source Oral, resp. rate 16, weight 66.3 kg, SpO2 95 %.  Physical Exam Vitals and nursing note reviewed.  Constitutional:      Appearance: Normal appearance. She is not ill-appearing.  HENT:     Head: Normocephalic and atraumatic.  Eyes:     General: No scleral icterus. Abdominal:     Palpations: Abdomen is soft.     Tenderness: There is no abdominal tenderness.     Comments: gravid  Skin:    General: Skin is warm and dry.  Neurological:     Mental Status: She is alert.  Psychiatric:        Mood and Affect: Mood normal.        Behavior: Behavior normal.   NST:  Baseline: 135 bpm, Variability: Good {> 6 bpm), Accelerations: Reactive, and Decelerations: Absent  MAU Course  Procedures  MDM Reactive NST & return of movement per patient. BPP 8/8 with normal AFI & fetus in cephalic presentation. Patient is reassured. She has f/u in the office on Monday.   Assessment  and Plan   1. NST (non-stress test) reactive   2. Decreased fetal movement, third trimester, not applicable or unspecified fetus   3. [redacted] weeks gestation of pregnancy    -fetal kick counts & reasons to return to MAU discussed  Judeth Horn 01/30/2021, 9:02 PM

## 2021-01-30 NOTE — MAU Note (Signed)
Tara Hull is a 25 y.o. at [redacted]w[redacted]d here in MAU reporting: feels like baby is getting stuck on the right side and is feeling less FM. States she is now feeling movement. States she called the office and they said they wanted her to get an u/s to see why the baby is getting stuck. No pain currently. No bleeding or LOF.  Onset of complaint: ongoing  Pain score: 0/10  Vitals:   01/30/21 1729  BP: 110/64  Pulse: 87  Resp: 16  Temp: 98.3 F (36.8 C)  SpO2: 95%     FHT:144  Lab orders placed from triage: none

## 2021-02-01 ENCOUNTER — Encounter (HOSPITAL_COMMUNITY): Payer: Self-pay | Admitting: Obstetrics and Gynecology

## 2021-02-01 ENCOUNTER — Other Ambulatory Visit: Payer: Self-pay

## 2021-02-01 ENCOUNTER — Inpatient Hospital Stay (HOSPITAL_COMMUNITY)
Admission: AD | Admit: 2021-02-01 | Discharge: 2021-02-03 | DRG: 807 | Disposition: A | Discharge status: Home / Self Care | Payer: BC Managed Care – PPO | Attending: Obstetrics and Gynecology | Admitting: Obstetrics and Gynecology

## 2021-02-01 DIAGNOSIS — O99824 Streptococcus B carrier state complicating childbirth: Secondary | ICD-10-CM | POA: Diagnosis present

## 2021-02-01 DIAGNOSIS — Z3A39 39 weeks gestation of pregnancy: Secondary | ICD-10-CM

## 2021-02-01 DIAGNOSIS — O26893 Other specified pregnancy related conditions, third trimester: Secondary | ICD-10-CM | POA: Diagnosis present

## 2021-02-01 DIAGNOSIS — Z20822 Contact with and (suspected) exposure to covid-19: Secondary | ICD-10-CM | POA: Diagnosis present

## 2021-02-01 LAB — CBC
HCT: 41.3 % (ref 36.0–46.0)
Hemoglobin: 14.3 g/dL (ref 12.0–15.0)
MCH: 30.4 pg (ref 26.0–34.0)
MCHC: 34.6 g/dL (ref 30.0–36.0)
MCV: 87.9 fL (ref 80.0–100.0)
Platelets: 215 10*3/uL (ref 150–400)
RBC: 4.7 MIL/uL (ref 3.87–5.11)
RDW: 12.7 % (ref 11.5–15.5)
WBC: 14.2 10*3/uL — ABNORMAL HIGH (ref 4.0–10.5)
nRBC: 0 % (ref 0.0–0.2)

## 2021-02-01 LAB — RESP PANEL BY RT-PCR (FLU A&B, COVID) ARPGX2
Influenza A by PCR: NEGATIVE
Influenza B by PCR: NEGATIVE
SARS Coronavirus 2 by RT PCR: NEGATIVE

## 2021-02-01 LAB — TYPE AND SCREEN
ABO/RH(D): B POS
Antibody Screen: NEGATIVE

## 2021-02-01 MED ORDER — SODIUM CHLORIDE 0.9 % IV SOLN
2.0000 g | Freq: Once | INTRAVENOUS | Status: AC
  Administered 2021-02-01: 2 g via INTRAVENOUS
  Filled 2021-02-01: qty 2000

## 2021-02-01 MED ORDER — ACETAMINOPHEN 325 MG PO TABS: 650.0000 mg | ORAL_TABLET | ORAL | Status: DC | PRN

## 2021-02-01 MED ORDER — LIDOCAINE HCL (PF) 1 % IJ SOLN
30.0000 mL | INTRAMUSCULAR | Status: AC | PRN
  Administered 2021-02-01: 30 mL via SUBCUTANEOUS
  Filled 2021-02-01 (×2): qty 30

## 2021-02-01 MED ORDER — ONDANSETRON HCL 4 MG/2ML IJ SOLN: 4.0000 mg | Freq: Four times a day (QID) | INTRAMUSCULAR | Status: DC | PRN

## 2021-02-01 MED ORDER — OXYTOCIN-SODIUM CHLORIDE 30-0.9 UT/500ML-% IV SOLN
2.5000 [IU]/h | INTRAVENOUS | Status: DC
  Administered 2021-02-01 (×2): 2.5 [IU]/h via INTRAVENOUS
  Filled 2021-02-01 (×2): qty 500

## 2021-02-01 MED ORDER — OXYCODONE-ACETAMINOPHEN 5-325 MG PO TABS: 1.0000 | ORAL_TABLET | ORAL | Status: DC | PRN

## 2021-02-01 MED ORDER — SOD CITRATE-CITRIC ACID 500-334 MG/5ML PO SOLN: 30.0000 mL | ORAL | Status: DC | PRN

## 2021-02-01 MED ORDER — OXYTOCIN BOLUS FROM INFUSION
333.0000 mL | Freq: Once | INTRAVENOUS | Status: AC
  Administered 2021-02-01: 333 mL via INTRAVENOUS

## 2021-02-01 MED ORDER — LACTATED RINGERS IV SOLN: 500.0000 mL | INTRAVENOUS | Status: DC | PRN

## 2021-02-01 MED ORDER — SODIUM CHLORIDE 0.9 % IV SOLN: 5.0000 10*6.[IU] | Freq: Once | INTRAVENOUS | Status: DC

## 2021-02-01 MED ORDER — PENICILLIN G POT IN DEXTROSE 60000 UNIT/ML IV SOLN
3.0000 10*6.[IU] | INTRAVENOUS | Status: DC
Start: 2021-02-02 — End: 2021-02-02

## 2021-02-01 MED ORDER — LACTATED RINGERS IV SOLN
INTRAVENOUS | Status: DC
  Administered 2021-02-01: 1000 mL via INTRAVENOUS

## 2021-02-01 MED ORDER — FLEET ENEMA 7-19 GM/118ML RE ENEM: 1.0000 | ENEMA | RECTAL | Status: DC | PRN

## 2021-02-01 MED ORDER — OXYCODONE-ACETAMINOPHEN 5-325 MG PO TABS: 2.0000 | ORAL_TABLET | ORAL | Status: DC | PRN

## 2021-02-01 NOTE — MAU Note (Signed)
House coverage notified of pts Doula Joanna Puff.

## 2021-02-01 NOTE — MAU Note (Signed)
Contractions since 3 am.  Now they are 5 min apart for last 2 hours. 2 cm in office. Bloody show small amts mucus today. Baby moving well. GBS is positive. No leaking.

## 2021-02-02 ENCOUNTER — Encounter (HOSPITAL_COMMUNITY): Payer: Self-pay | Admitting: Obstetrics and Gynecology

## 2021-02-02 LAB — CBC
HCT: 37.4 % (ref 36.0–46.0)
Hemoglobin: 12.9 g/dL (ref 12.0–15.0)
MCH: 30.3 pg (ref 26.0–34.0)
MCHC: 34.5 g/dL (ref 30.0–36.0)
MCV: 87.8 fL (ref 80.0–100.0)
Platelets: 197 10*3/uL (ref 150–400)
RBC: 4.26 MIL/uL (ref 3.87–5.11)
RDW: 12.7 % (ref 11.5–15.5)
WBC: 15.9 10*3/uL — ABNORMAL HIGH (ref 4.0–10.5)
nRBC: 0 % (ref 0.0–0.2)

## 2021-02-02 LAB — RPR: RPR Ser Ql: NONREACTIVE

## 2021-02-02 MED ORDER — SENNOSIDES-DOCUSATE SODIUM 8.6-50 MG PO TABS: 2.0000 | ORAL_TABLET | ORAL | Status: DC

## 2021-02-02 MED ORDER — OXYCODONE HCL 5 MG PO TABS: 10.0000 mg | ORAL_TABLET | ORAL | Status: DC | PRN

## 2021-02-02 MED ORDER — METHYLERGONOVINE MALEATE 0.2 MG PO TABS
0.2000 mg | ORAL_TABLET | ORAL | Status: DC | PRN
Start: 2021-02-02 — End: 2021-02-04

## 2021-02-02 MED ORDER — ESCITALOPRAM OXALATE 10 MG PO TABS
10.0000 mg | ORAL_TABLET | Freq: Every day | ORAL | Status: DC
  Administered 2021-02-02: 10 mg via ORAL
  Administered 2021-02-03: 5 mg via ORAL
  Filled 2021-02-02 (×2): qty 1

## 2021-02-02 MED ORDER — COCONUT OIL OIL
1.0000 "application " | TOPICAL_OIL | Status: DC | PRN
  Administered 2021-02-02: 1 via TOPICAL

## 2021-02-02 MED ORDER — ZOLPIDEM TARTRATE 5 MG PO TABS: 5.0000 mg | ORAL_TABLET | Freq: Every evening | ORAL | Status: DC | PRN

## 2021-02-02 MED ORDER — ONDANSETRON HCL 4 MG/2ML IJ SOLN: 4.0000 mg | INTRAMUSCULAR | Status: DC | PRN

## 2021-02-02 MED ORDER — DIPHENHYDRAMINE HCL 25 MG PO CAPS: 25.0000 mg | ORAL_CAPSULE | Freq: Four times a day (QID) | ORAL | Status: DC | PRN

## 2021-02-02 MED ORDER — DIBUCAINE (PERIANAL) 1 % EX OINT: 1.0000 "application " | TOPICAL_OINTMENT | CUTANEOUS | Status: DC | PRN

## 2021-02-02 MED ORDER — METHYLERGONOVINE MALEATE 0.2 MG/ML IJ SOLN
0.2000 mg | Freq: Once | INTRAMUSCULAR | Status: AC
  Administered 2021-02-02: 0.2 mg via INTRAMUSCULAR
  Filled 2021-02-02: qty 1

## 2021-02-02 MED ORDER — ONDANSETRON HCL 4 MG PO TABS: 4.0000 mg | ORAL_TABLET | ORAL | Status: DC | PRN

## 2021-02-02 MED ORDER — SIMETHICONE 80 MG PO CHEW: 80.0000 mg | CHEWABLE_TABLET | ORAL | Status: DC | PRN

## 2021-02-02 MED ORDER — OXYCODONE HCL 5 MG PO TABS: 5.0000 mg | ORAL_TABLET | ORAL | Status: DC | PRN

## 2021-02-02 MED ORDER — METHYLERGONOVINE MALEATE 0.2 MG/ML IJ SOLN: 0.2000 mg | INTRAMUSCULAR | Status: DC | PRN

## 2021-02-02 MED ORDER — TRANEXAMIC ACID-NACL 1000-0.7 MG/100ML-% IV SOLN
1000.0000 mg | INTRAVENOUS | Status: AC
  Administered 2021-02-02: 1000 mg via INTRAVENOUS
  Filled 2021-02-02: qty 100

## 2021-02-02 MED ORDER — BENZOCAINE-MENTHOL 20-0.5 % EX AERO
1.0000 "application " | INHALATION_SPRAY | CUTANEOUS | Status: DC | PRN
  Administered 2021-02-02: 1 via TOPICAL
  Filled 2021-02-02: qty 56

## 2021-02-02 MED ORDER — IBUPROFEN 600 MG PO TABS: 600.0000 mg | ORAL_TABLET | Freq: Four times a day (QID) | ORAL | Status: DC

## 2021-02-02 MED ORDER — IBUPROFEN 100 MG/5ML PO SUSP
600.0000 mg | Freq: Four times a day (QID) | ORAL | Status: DC
  Administered 2021-02-02 – 2021-02-03 (×3): 300 mg via ORAL
  Administered 2021-02-03: 600 mg via ORAL
  Filled 2021-02-02 (×4): qty 30

## 2021-02-02 MED ORDER — FAMOTIDINE 20 MG PO TABS
20.0000 mg | ORAL_TABLET | Freq: Two times a day (BID) | ORAL | Status: DC
  Administered 2021-02-02: 20 mg via ORAL
  Filled 2021-02-02 (×3): qty 1

## 2021-02-02 MED ORDER — ACETAMINOPHEN 160 MG/5ML PO SOLN
650.0000 mg | ORAL | Status: DC | PRN
  Administered 2021-02-02: 650 mg via ORAL
  Administered 2021-02-02: 325 mg via ORAL
  Filled 2021-02-02 (×2): qty 20.3

## 2021-02-02 MED ORDER — PRENATAL MULTIVITAMIN CH: 1.0000 | ORAL_TABLET | Freq: Every day | ORAL | Status: DC

## 2021-02-02 MED ORDER — ACETAMINOPHEN 325 MG PO TABS: 650.0000 mg | ORAL_TABLET | ORAL | Status: DC | PRN

## 2021-02-02 MED ORDER — WITCH HAZEL-GLYCERIN EX PADS: 1.0000 "application " | MEDICATED_PAD | CUTANEOUS | Status: DC | PRN

## 2021-02-02 MED ORDER — COMPLETENATE 29-1 MG PO CHEW
1.0000 | CHEWABLE_TABLET | Freq: Every day | ORAL | Status: DC
  Administered 2021-02-02 – 2021-02-03 (×2): 1 via ORAL
  Filled 2021-02-02 (×2): qty 1

## 2021-02-02 MED ORDER — TETANUS-DIPHTH-ACELL PERTUSSIS 5-2.5-18.5 LF-MCG/0.5 IM SUSY: 0.5000 mL | PREFILLED_SYRINGE | Freq: Once | INTRAMUSCULAR | Status: DC

## 2021-02-02 NOTE — H&P (Signed)
Tara Hull is a 25 y.o. female presenting for labor and s/p SROM. OB History     Gravida  1   Para  1   Term  1   Preterm      AB      Living  1      SAB      IAB      Ectopic      Multiple  0   Live Births  1          Past Medical History:  Diagnosis Date   Anxiety    Heart murmur    Past Surgical History:  Procedure Laterality Date   WISDOM TOOTH EXTRACTION  2015   Family History: family history includes Mental retardation in her brother; Other in her brother. Social History:  reports that she has never smoked. She has never used smokeless tobacco. She reports that she does not drink alcohol and does not use drugs.     Maternal Diabetes: No Genetic Screening: Normal Maternal Ultrasounds/Referrals: Normal Fetal Ultrasounds or other Referrals:  None Maternal Substance Abuse:  No Significant Maternal Medications:  None Significant Maternal Lab Results:  None Other Comments:  None  Review of Systems History Dilation: 10 Effacement (%): 100 Station: Plus 2 Exam by:: Elon Spanner, MD Blood pressure 107/69, pulse 69, temperature 98.1 F (36.7 C), temperature source Oral, resp. rate 18, height 5\' 4"  (1.626 m), weight 65.3 kg, SpO2 97 %, unknown if currently breastfeeding. Exam Physical Exam  NAD, A&O NWOB Abd soft, nondistended, gravid  Prenatal labs: ABO, Rh: --/--/B POS (09/10 2224) Antibody: NEG (09/10 2224) Rubella: Immune (02/03 0000) RPR: NON REACTIVE (09/10 2224)  HBsAg: Negative (02/03 0000)  HIV: Non-reactive (02/03 0000)  GBS:   POSITIVE  Assessment/Plan: 25 yo G1P0 @ 84 wga presenting in labor and s/p SROM ready to push.  GBS positive. Given advanced cervical dilation, ampicillin was given.     25 02/02/2021, 11:55 PM

## 2021-02-02 NOTE — MAU Note (Signed)
Pt transferred to LD via stretcher. MD in house.

## 2021-02-02 NOTE — Progress Notes (Signed)
Post Partum Day 1 Subjective: no complaints, up ad lib, voiding, and tolerating PO  Objective: Blood pressure 118/77, pulse (!) 108, temperature 97.8 F (36.6 C), temperature source Oral, resp. rate 18, height 5\' 4"  (1.626 m), weight 65.3 kg, SpO2 100 %, unknown if currently breastfeeding.  Physical Exam:  General: alert Lochia: appropriate Uterine Fundus: firm Incision: N/A DVT Evaluation: No evidence of DVT seen on physical exam.  Recent Labs    02/01/21 2224 02/02/21 0420  HGB 14.3 12.9  HCT 41.3 37.4    Assessment/Plan: Plan for discharge tomorrow, Breastfeeding, and Circumcision prior to discharge Baby only 9 hours old and not yet ready for circ.    LOS: 1 day   04/04/21 02/02/2021, 8:50 AM

## 2021-02-02 NOTE — Lactation Note (Signed)
This note was copied from a baby's chart. Lactation Consultation Note  Patient Name: Tara Hull [id number] Date: 02/02/2021 Reason for consult: Initial assessment;Term;Primapara Age:24 hours  Mom states she spoke with pediatrician about concerns if baby was getting enough.  Baby is feeding frequently.  Mom feels he is latching well.  She states she is exhausted when [facility] entered and baby was feeding.  LC offered to help adjust positioning as infant appeared to be latched mostly to the nipple.  LC provided education 8-12 feeds in 24, STS,hand expression, and cluster feeding.    Mom denies any questions or concerns at this time. LC encouraged her to join the [facility] and shared the OP LC and phone line resource with mom.  LC also encouraged her to seek lactation support from Jimmye Norman at [facility] I needed where baby will be seen.   Maternal Data Has patient been taught Hand Expression?: No Does the patient have breastfeeding experience prior to this delivery?: No  Feeding Mother's Current Feeding Choice: Breast Milk  LATCH Score Latch: Grasps breast easily, tongue down, lips flanged, rhythmical sucking.  Audible Swallowing: A few with stimulation  Type of Nipple: Everted at rest and after stimulation  Comfort (Breast/Nipple): Filling, red/small blisters or bruises, mild/mod discomfort  Hold (Positioning): Assistance needed to correctly position infant at breast and maintain latch.  LATCH Score: 7   Lactation Tools Discussed/Used Tools: Coconut oil  Interventions Interventions: Breast feeding basics reviewed;Assisted with latch;Skin to skin;Breast massage;Support pillows;Position options;Adjust position;Education (provided pillows and helped bring infant closer for a deeper latch)  Discharge    Consult Status Consult Status: Follow-up Date: 02/03/21 Follow-up type: In-patient    Maryruth Hancock [facility] 02/02/2021, 1:20 PM

## 2021-02-03 MED ORDER — IBUPROFEN 100 MG/5ML PO SUSP: 600.0000 mg | Freq: Four times a day (QID) | ORAL | 2 refills | Status: DC | PRN

## 2021-02-03 NOTE — Social Work (Signed)
MOB was referred for history of anxiety.   * Referral screened out by Clinical Social Worker because none of the following criteria appear to apply:  ~ History of anxiety/depression during this pregnancy, or of post-partum depression following prior delivery. ~ Diagnosis of anxiety and/or depression within last 3 years. OR * MOB's symptoms currently being treated with medication and/or therapy. CSW reviewed chart and notes MOB currently treating with Lexapro 10mg.   Please contact the Clinical Social Worker if needs arise, by MOB request, or if MOB scores greater than 9/yes to question 10 on Edinburgh Postpartum Depression Screen.  Halayna Blane, LCSWA Clinical Social Work [facility]  [phone]  

## 2021-02-03 NOTE — Lactation Note (Signed)
This note was copied from a baby's chart. Lactation Consultation Note  Patient Name: Tara Hull [id number] Date: 02/03/2021 Reason for consult: Follow-up assessment Age:24 hours  LC in to room for follow up. Mother states breastfeeding is going well. Per mother, infant is clusterfeeding. Talked about weight loss, voids and stools as signs of good milk transfer. Encouraged hand expression to "top infant up". Reinforced colostrum to nipples, air-dry and coconut oil/comfort gels for nipple soreness.    Plan: 1-Aim for a deep, comfortable latch, breastfeeding on demand or 8-12 times in 24h period. 2-Encouraged maternal rest, hydration and food intake.   Contact LC as needed for feeds/support/concerns/questions. All questions answered at this time.    Maternal Data Has patient been taught Hand Expression?: Yes  Feeding Mother's Current Feeding Choice: Breast Milk  Interventions Interventions: Breast feeding basics reviewed;Skin to skin;Hand express;Hand pump;Education;Expressed milk  Discharge Discharge Education: Engorgement and breast care Pump: Personal  Consult Status Consult Status: Follow-up Date: 02/04/21 Follow-up type: In-patient    Tara Hull A Tara Hull 02/03/2021, 6:32 PM

## 2021-02-03 NOTE — Progress Notes (Signed)
Post Partum Day 1 Subjective: no complaints, up ad lib, voiding, and tolerating PO  Objective: Blood pressure 108/71, pulse 64, temperature 97.7 F (36.5 C), temperature source Oral, resp. rate 18, height 5\' 4"  (1.626 m), weight 65.3 kg, SpO2 99 %, unknown if currently breastfeeding.  Physical Exam:  General: alert, cooperative, appears stated age, and no distress Lochia: appropriate Uterine Fundus: firm Incision:   DVT Evaluation: No evidence of DVT seen on physical exam.  Recent Labs    02/01/21 2224 02/02/21 0420  HGB 14.3 12.9  HCT 41.3 37.4    Assessment/Plan: Plan for discharge tomorrow, Breastfeeding, and Circumcision prior to discharge Baby has to stay 48h due to GBS and abx not 4 h before delivery   LOS: 2 days   04/04/21 02/03/2021, 9:47 AM

## 2021-02-03 NOTE — Discharge Summary (Signed)
Postpartum Discharge Summary       Patient Name: Tara Hull DOB: 10/31/1995 MRN: [id number]  Date of admission: 02/01/2021 Delivery date:02/01/2021  Delivering provider: Tyson Dense  Date of discharge: 02/03/2021  Admitting diagnosis: Normal labor [O80, Z37.9] Intrauterine pregnancy: [redacted]w[redacted]d    Secondary diagnosis:  Active Problems:   Normal labor  Additional problems:      Discharge diagnosis: Term Pregnancy Delivered                                              Post partum procedures:   Augmentation: N/A Complications: None  Hospital course: Onset of Labor With Vaginal Delivery      25y.o. yo G1P1001 at 320w4das admitted in Active Labor on 02/01/2021. Patient had an uncomplicated labor course as follows:  Membrane Rupture Time/Date: 11:09 PM ,02/01/2021   Delivery Method:Vaginal, Spontaneous  Episiotomy: None  Lacerations:  2nd degree  Patient had an uncomplicated postpartum course.  She is ambulating, tolerating a regular diet, passing flatus, and urinating well. Patient is discharged home in stable condition on 02/03/21.  Newborn Data: Birth date:02/01/2021  Birth time:11:20 PM  Gender:Female  Living status:Living  Apgars:7 ,8  Weight:3016 g   Magnesium Sulfate received: No BMZ received: No Rhophylac:N/A MMR:N/A T-DaP:Given prenatally Flu: N/A Transfusion:No  Physical exam  Vitals:   02/02/21 1332 02/02/21 1843 02/02/21 2042 02/03/21 0518  BP: 103/66 112/75 107/69 108/71  Pulse: 60 76 69 64  Resp: _0 Temp: 98.2 F (36.8 C)  98.1 F (36.7 C) 97.7 F (36.5 C)  TempSrc:   Oral Oral  SpO2:  98% 97% 99%  Weight:      Height:       General: alert, cooperative, and no distress Lochia: appropriate Uterine Fundus: firm Incision: N/A DVT Evaluation: No evidence of DVT seen on physical exam. Labs: Lab Results  Component Value Date   WBC 15.9 (H) 02/02/2021   HGB 12.9 02/02/2021   HCT 37.4 02/02/2021   MCV 87.8 02/02/2021    PLT 197 02/02/2021   CMP Latest Ref Rng & Units 12/21/2019  Glucose 70 - 99 mg/dL 154(H)  BUN 6 - 20 mg/dL 15  Creatinine 0.44 - 1.00 mg/dL 0.88  Sodium 135 - 145 mmol/L 139  Potassium 3.5 - 5.1 mmol/L 3.5  Chloride 98 - 111 mmol/L 103  CO2 22 - 32 mmol/L 27  Calcium 8.9 - 10.3 mg/dL 9.6   Edinburgh Score: Edinburgh Postnatal Depression Scale Screening Tool 02/03/2021  I have been able to laugh and see the funny side of things. 0  I have looked forward with enjoyment to things. 0  I have blamed myself unnecessarily when things went wrong. 1  I have been anxious or worried for no good reason. 1  I have felt scared or panicky for no good reason. 1  Things have been getting on top of me. 1  I have been so unhappy that I have had difficulty sleeping. 0  I have felt sad or miserable. 1  I have been so unhappy that I have been crying. 1  The thought of harming myself has occurred to me. 0  Edinburgh Postnatal Depression Scale Total 6      After visit meds:  Allergies as of 02/03/2021       Reactions   Progesterone  Other (See Comments)   Patient stated that all four limbs went numb. She stated that she can take the artifical version of progesterone, she stated that she received it and was fine after.        Medication List     STOP taking these medications    ferrous sulfate 325 (65 FE) MG EC tablet   multivitamin tablet       TAKE these medications    escitalopram 10 MG tablet Commonly known as: Lexapro Take 1 tablet (10 mg total) by mouth daily.   ibuprofen 100 MG/5ML suspension Commonly known as: ADVIL Take 30 mLs (600 mg total) by mouth every 6 (six) hours as needed for moderate pain or mild pain.   multivitamin-prenatal 27-0.8 MG Tabs tablet Take 1 tablet by mouth daily at 12 noon.         Discharge home in stable condition Infant Feeding: Breast Infant Disposition:home with mother Discharge instruction: per After Visit Summary and Postpartum  booklet. Activity: Advance as tolerated. Pelvic rest for 6 weeks.  Diet: routine diet Anticipated Birth Control: Unsure Postpartum Appointment:6 weeks Additional Postpartum F/U:    Future Appointments:No future appointments. Follow up Visit:      02/03/2021 Luz Lex, MD

## 2021-02-04 ENCOUNTER — Ambulatory Visit: Payer: Self-pay

## 2021-02-04 NOTE — Lactation Note (Signed)
This note was copied from a baby's chart. Lactation Consultation Note  Patient Name: Tara Hull Date: 02/04/2021 Reason for consult: Follow-up assessment;Term;Primapara;1st time breastfeeding Age:24 hours   P1 mother whose infant is now 21 hours old.  This is a term baby at 39+4 weeks.  Mother reports that her son "Tara Hull" is latching and feeding well; frequent feedings.  He is voiding/stooling and the stools are now starting to transition to yellow and seedy.  Reviewed plan of care for after discharge.    Mother has our OP phone number for any further concerns.  She also has a Advertising copywriter at her pediatrician's office; suggested she get an appointment for latch observation as needed.  Mother very knowledgeable about breast feeding basics.  Father present and asleep on the couch.   Maternal Data    Feeding Mother's Current Feeding Choice: Breast Milk  LATCH Score Latch: Grasps breast easily, tongue down, lips flanged, rhythmical sucking.  Audible Swallowing: A few with stimulation  Type of Nipple: Everted at rest and after stimulation  Comfort (Breast/Nipple): Filling, red/small blisters or bruises, mild/mod discomfort  Hold (Positioning): No assistance needed to correctly position infant at breast.  LATCH Score: 8   Lactation Tools Discussed/Used    Interventions Interventions: Education  Discharge Discharge Education: Engorgement and breast care  Consult Status Consult Status: Complete Date: 02/04/21 Follow-up type: Call as needed    Allana Shrestha R Sadler Teschner 02/04/2021, 8:59 AM

## 2021-02-05 ENCOUNTER — Inpatient Hospital Stay (HOSPITAL_COMMUNITY): Payer: BC Managed Care – PPO

## 2021-02-05 ENCOUNTER — Inpatient Hospital Stay (HOSPITAL_COMMUNITY)
Admission: AD | Admit: 2021-02-05 | Payer: BC Managed Care – PPO | Source: Home / Self Care | Admitting: Obstetrics and Gynecology

## 2021-02-15 ENCOUNTER — Telehealth (HOSPITAL_COMMUNITY): Payer: Self-pay | Admitting: *Deleted

## 2021-02-15 NOTE — Telephone Encounter (Signed)
Patient voiced no questions or concerns regarding her own health. EPDS = 6. Patient voiced no questions or concerns regarding baby at this time. Patient reported infant sleeps in a bassinet on his back. RN reviewed ABCs of safe sleep - patient verbalized understanding. Patient requested RN email information on hospital's virtual postpartum classes and support groups. Email sent. Deforest Hoyles, RN, 02/15/21, [phone].

## 2021-07-31 IMAGING — DX DG CHEST 1V PORT
1 series · 1 of 1 positions shown · non-contrast
Comparison: None.

CLINICAL DATA: Swallowed foreign body.

EXAM:
PORTABLE CHEST 1 VIEW

[chest ap]
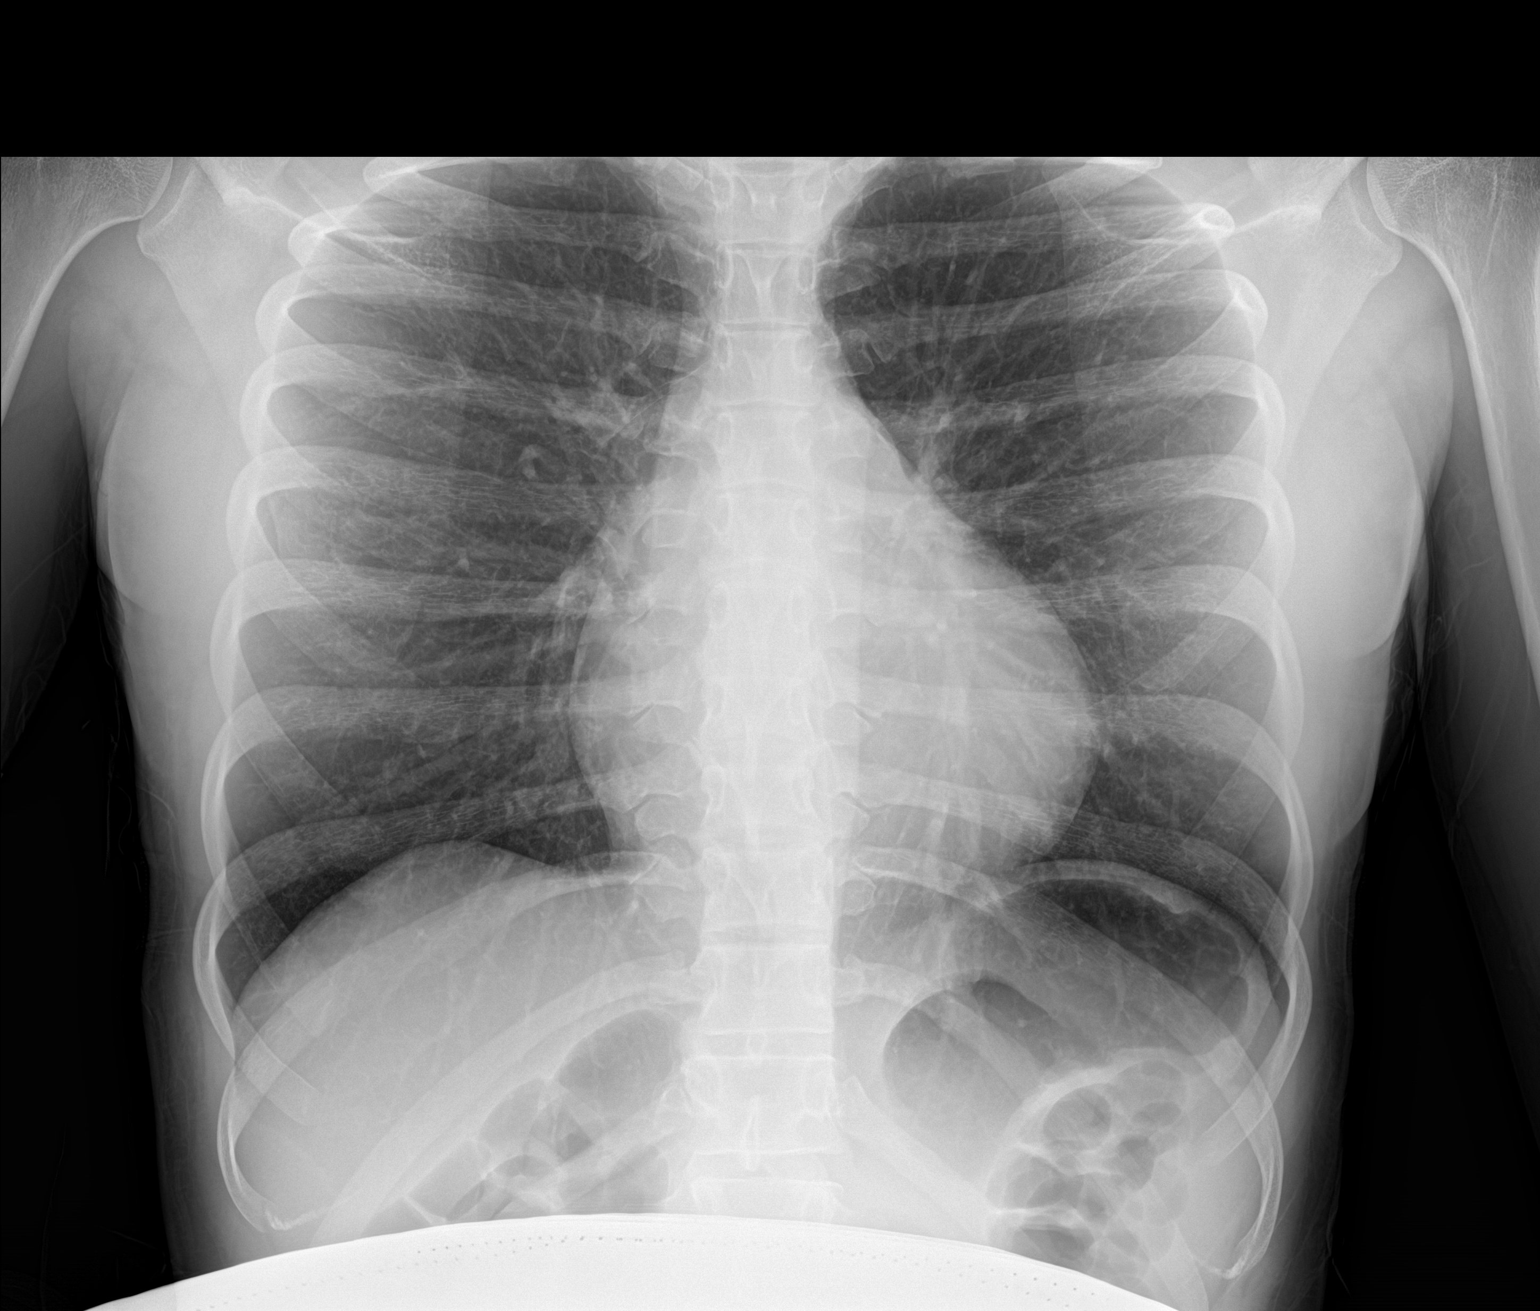

[1 of 1 positions shown; findings below may reference images not displayed]

FINDINGS: The cardiac silhouette, mediastinal and hilar contours are normal.

The lungs are clear.  No pleural effusions.  No pulmonary lesions.

The upper abdominal bowel gas pattern is unremarkable.

No radiopaque foreign bodies are identified.

The bony thorax is intact.
IMPRESSION: Normal chest x-ray.

## 2021-09-11 IMAGING — US US MFM OB LIMITED
1 series · 15 of 20 positions shown · non-contrast
Comparison: none

[Series 1: us mfm ob limited · 20 acquisitions, 15 frames shown]
[im 1/20]
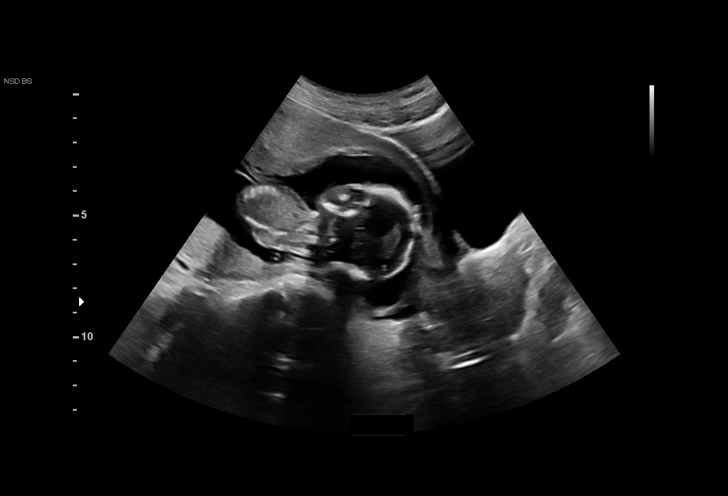
[im 3/20]
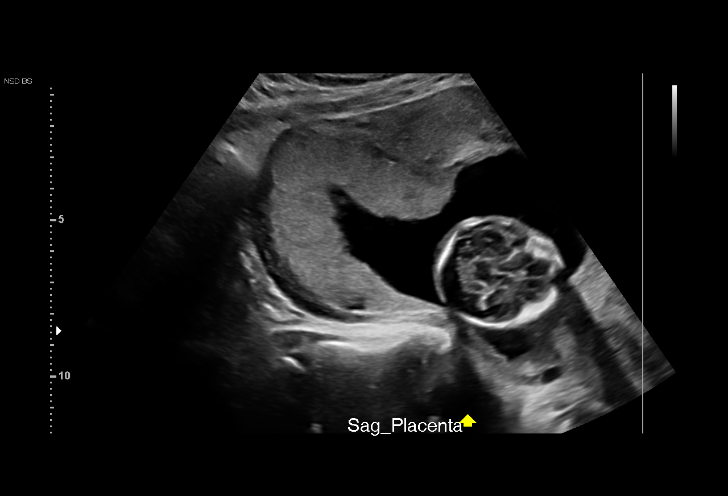
[im 4/20]
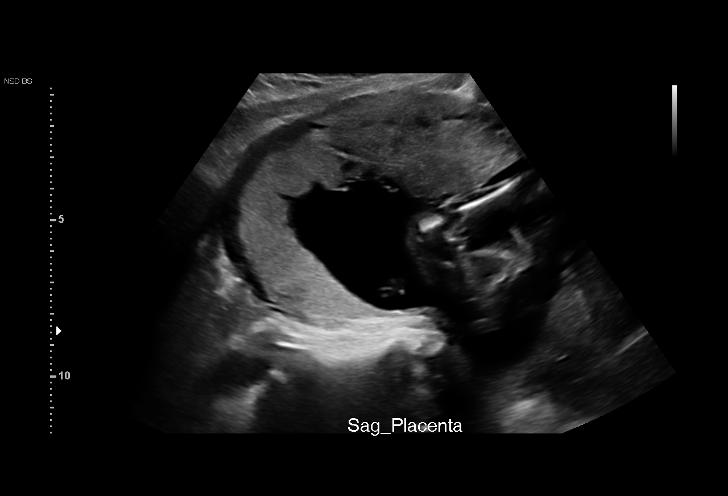
[im 5/20]
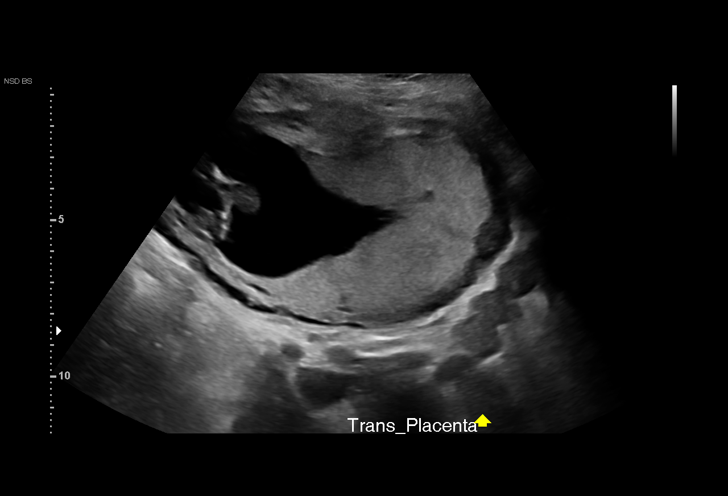
[im 7/20]
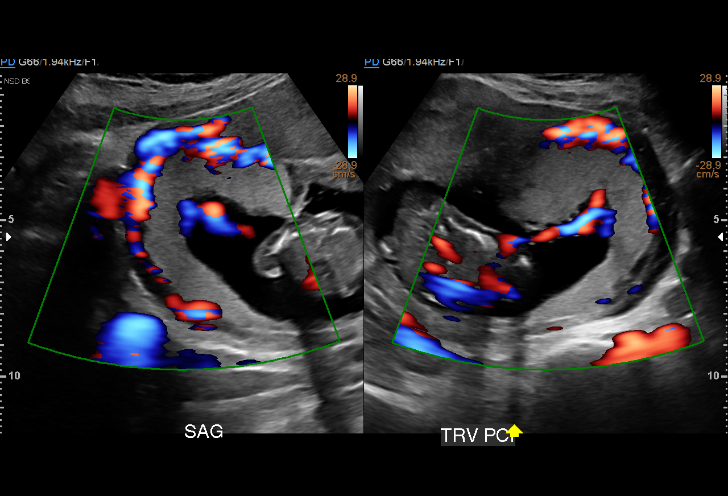
[im 8/20]
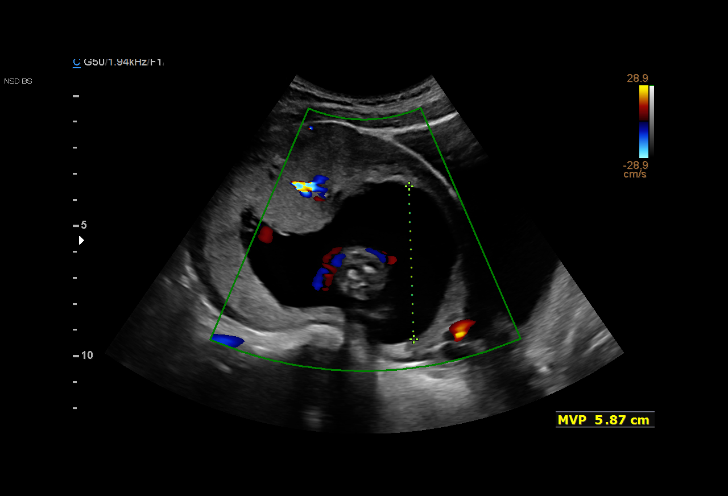
[im 9/20]
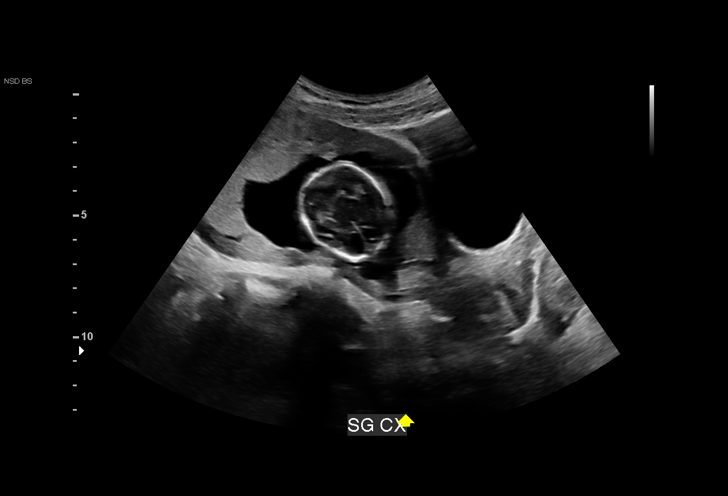
[im 11/20]
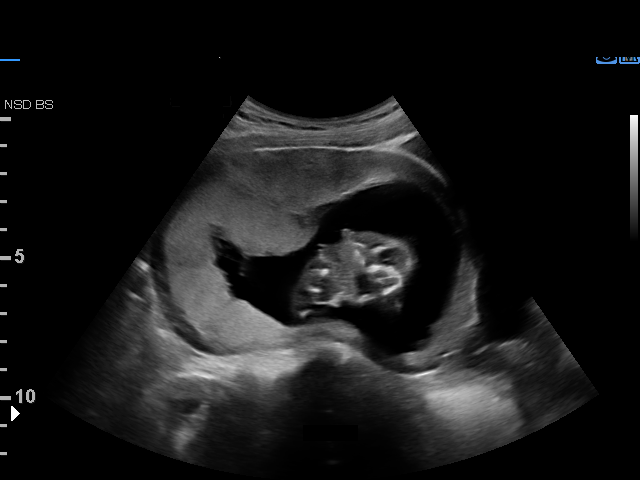
[im 12/20]
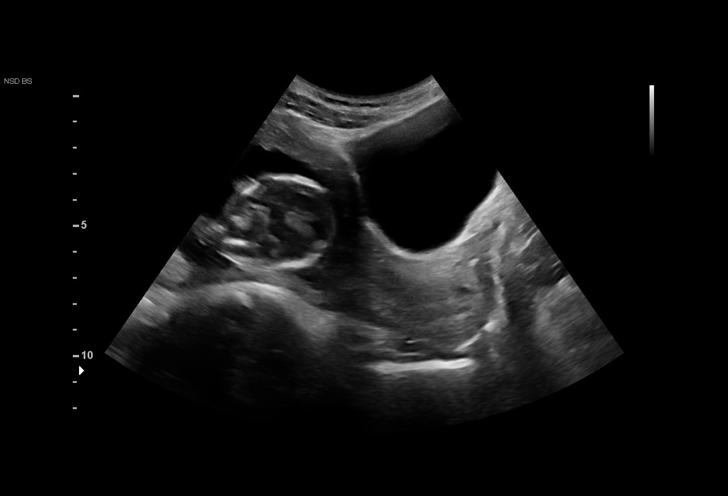
[im 13/20]
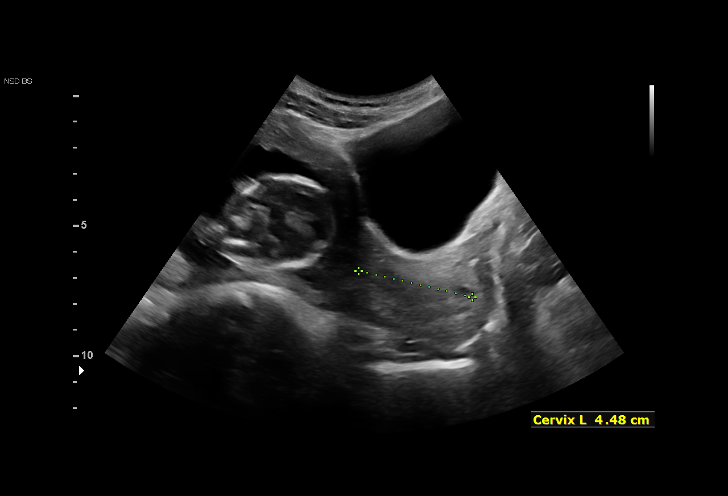
[im 15/20]
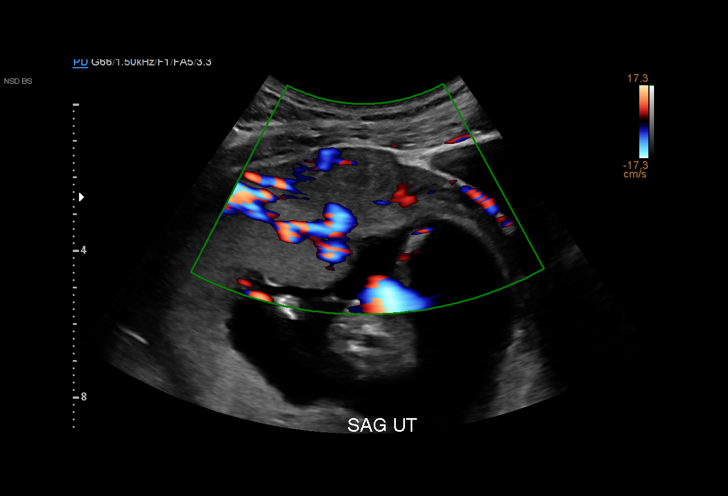
[im 16/20]
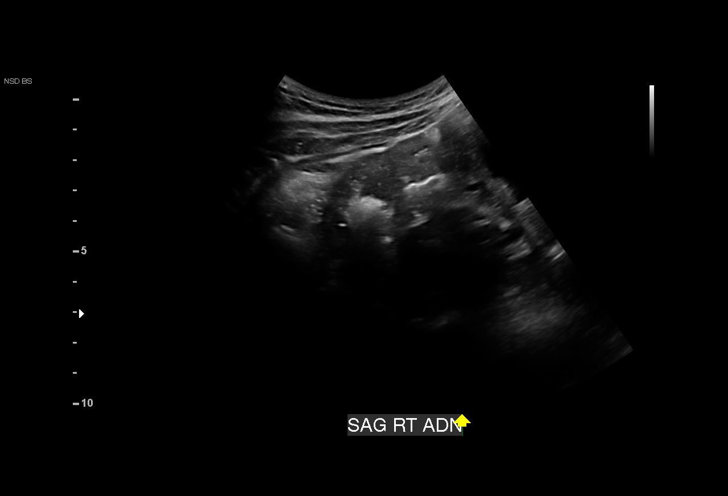
[im 17/20]
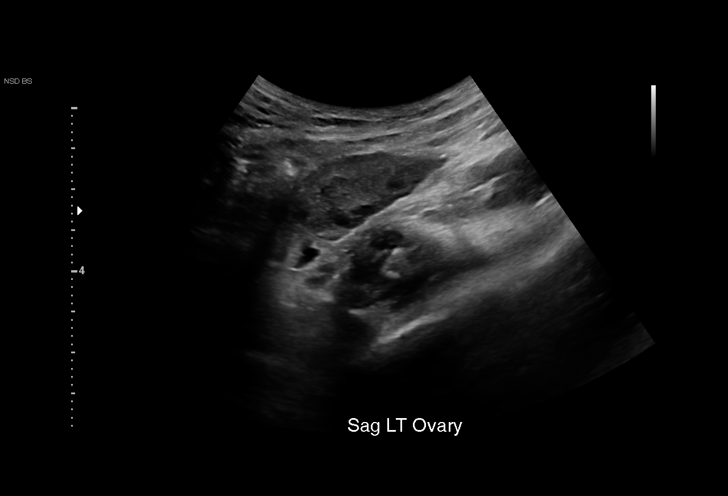
[im 19/20]
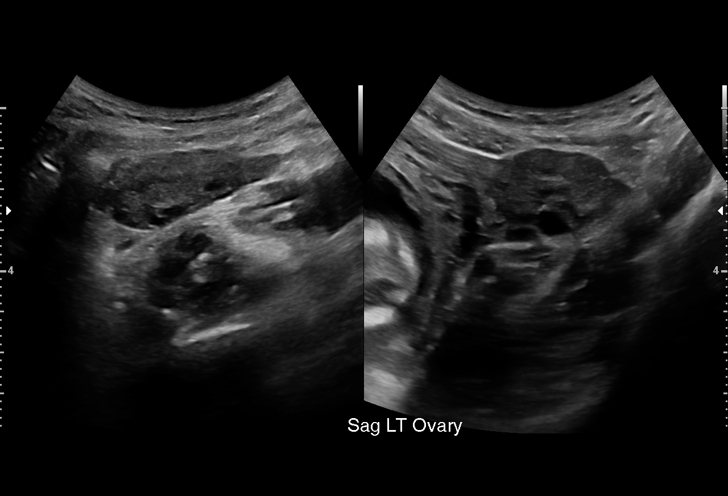
[im 20/20]
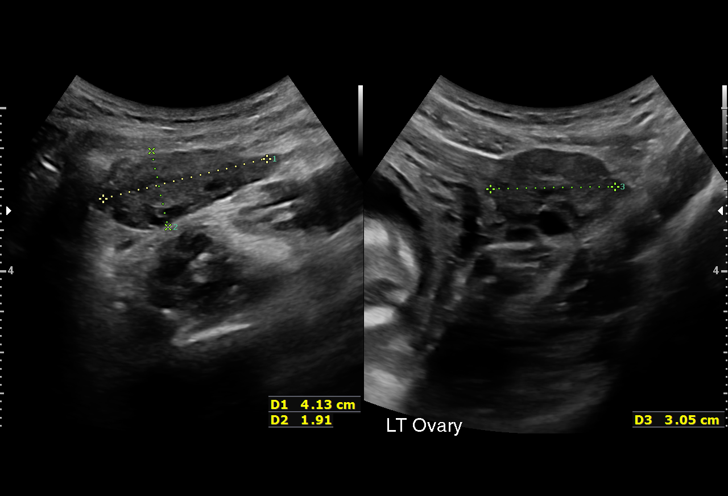

[15 of 20 positions shown; findings below may reference images not displayed]

Indications

 15 weeks gestation of pregnancy
 Evaluate amniotic fluid (? Leaking)
 Pelvic pain affecting pregnancy in second
 trimester (Cramping)
 Encounter for cervical length
Fetal Evaluation

 Num Of Fetuses:          1
 Fetal Heart Rate(bpm):   148
 Cardiac Activity:        Observed
 Presentation:            Cephalic
 Placenta:                Posterior Fundal
 P. Cord Insertion:       Visualized, central

 Amniotic Fluid
 AFI FV:      Within normal limits

                             Largest Pocket(cm)

OB History

 Gravidity:    1         Term:   0        Prem:   0        SAB:   0
 TOP:          0       Ectopic:  0        Living: 0
Gestational Age

 LMP:           16w 4d        Date:  04/24/20                 EDD:   01/29/21
 Clinical EDD:  15w 5d                                        EDD:   02/04/21
 Best:          15w 5d     Det. By:  Clinical EDD             EDD:   02/04/21
Cervix Uterus Adnexa

 Cervix
 Length:           4.44  cm.
 Normal appearance by transabdominal scan.

 Right Ovary
 Not visualized.

 Left Ovary
 Within normal limits.

 Adnexa
 No abnormality visualized.
Impression

 Limited exam to evaluate premature rupture of membranes.
 Normal amniotic fluid volume
 Good fetal movement
Recommendations

 Clinical correlation recommended.

## 2022-08-28 ENCOUNTER — Ambulatory Visit (INDEPENDENT_AMBULATORY_CARE_PROVIDER_SITE_OTHER): Payer: BC Managed Care – PPO

## 2022-08-28 ENCOUNTER — Encounter: Payer: Self-pay | Admitting: Cardiology

## 2022-08-28 ENCOUNTER — Ambulatory Visit: Payer: BC Managed Care – PPO | Attending: Cardiology | Admitting: Cardiology

## 2022-08-28 VITALS — BP 102/60 | HR 79 | Ht 64.0 in | Wt 127.4 lb

## 2022-08-28 DIAGNOSIS — R002 Palpitations: Secondary | ICD-10-CM | POA: Diagnosis not present

## 2022-08-28 DIAGNOSIS — Z3A15 15 weeks gestation of pregnancy: Secondary | ICD-10-CM | POA: Diagnosis not present

## 2022-08-28 NOTE — Progress Notes (Signed)
Cardio-Obstetrics Clinic  New Evaluation  Date:  08/28/2022   ID:  Tara Hull, DOB 1995/07/16, MRN [id number]  PCP:  Patient, No Pcp Per   [facility] Providers Cardiologist:  None  Electrophysiologist:  None       Referring MD: Candice Camp, MD   Chief Complaint: " I am having palpitations"  History of Present Illness:    Tara Hull is a 27 y.o. female [G1P1001] who is being seen today for the evaluation of palpitations  at the request of Candice Camp, MD.  During her first pregnancy in 2022 she experienced atypical chest pain and increasing heart rate. At that time she has an echo which was normal. Postpartum she did not experience any symptoms.   Today she is [redacted] weeks pregnant and presents to be evaluated for palpiations and low blood pressure during pregnancy. She has been experiencing intermittent abrup onset of fast heart. Thankfully no lightheadedness or dizziness. No chest pain. She tells me that she also has noticed low systolic blood pressure.   No other complaints at this time.   Prior CV Studies Reviewed: The following studies were reviewed today:   Past Medical History:  Diagnosis Date   Anxiety    Heart murmur     Past Surgical History:  Procedure Laterality Date   WISDOM TOOTH EXTRACTION  2015      OB History     Gravida  1   Para  1   Term  1   Preterm      AB      Living  1      SAB      IAB      Ectopic      Multiple  0   Live Births  1               Current Medications: Current Meds  Medication Sig   Prenatal Vit-Fe Fumarate-FA (MULTIVITAMIN-PRENATAL) 27-0.8 MG TABS tablet Take 1 tablet by mouth daily at 12 noon.     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 She works at [employer]. Lives with her parents and younger brothers.   HC: 52.9 cm   PHQ-9 Total Score: 12   SCARED Total Score: 12   Social Determinants of Health   Financial Resource Strain: Not on file  Food Insecurity: Not on file  Transportation Needs: Not on file  Physical Activity: Not on file  Stress: Not on file  Social Connections: Not on file      Family History  Problem Relation Age of Onset   Mental retardation Brother    Other Brother        cleidocranial dysplasia      ROS:   Please see the history of present illness.    palpiations All other systems reviewed and are  negative.   Labs/EKG Reviewed:    EKG:   EKG is was ordered today.  The ekg ordered today demonstrates sinus rhythm with arhythmia   TTE 11/14/2021 IMPRESSIONS     1. Left ventricular ejection fraction, by estimation, is 60 to 65%. The  left ventricle has normal function. The left ventricle has no regional  wall motion abnormalities. Left ventricular diastolic parameters were  normal. The average left ventricular  global longitudinal strain is -26.5 %. The global longitudinal strain is  normal.   2. Right ventricular systolic function is normal. The right ventricular  size is normal.   3. The mitral valve is normal in structure. No evidence of mitral valve  regurgitation. No evidence of mitral stenosis.   4. The aortic valve is tricuspid. Aortic valve regurgitation is not  visualized. No aortic stenosis is present.   5. The inferior vena cava is normal in size with greater than 50%  respiratory variability, suggesting right atrial pressure of 3 mmHg.   Comparison(s): No prior  Echocardiogram.   Conclusion(s)/Recommendation(s): Normal biventricular function without  evidence of hemodynamically significant valvular heart disease.   FINDINGS   Left Ventricle: Left ventricular ejection fraction, by estimation, is 60  to 65%. The left ventricle has normal function. The left ventricle has no  regional wall motion abnormalities. The average left ventricular global  longitudinal strain is -26.5 %.  The global longitudinal strain is normal. The left ventricular internal  cavity size was normal in size. There is no left ventricular hypertrophy.  Left ventricular diastolic parameters were normal.   Right Ventricle: The right ventricular size is normal. No increase in  right ventricular wall thickness. Right ventricular systolic function is  normal.   Left Atrium: Left atrial size was normal in size.   Right Atrium: Right atrial size was normal in size.   Pericardium: There is no evidence of pericardial effusion.   Mitral Valve: The mitral valve is normal in structure. No evidence of  mitral valve regurgitation. No evidence of mitral valve stenosis.   Tricuspid Valve: The tricuspid valve is normal in structure. Tricuspid  valve regurgitation is not demonstrated. No evidence of tricuspid  stenosis.   Aortic Valve: The aortic valve is tricuspid. Aortic valve regurgitation is  not visualized. No aortic stenosis is present.   Pulmonic Valve: The pulmonic valve was not well visualized. Pulmonic valve  regurgitation is trivial. No evidence of pulmonic stenosis.   Aorta: The aortic root, ascending aorta, aortic arch and descending aorta  are all structurally normal, with no evidence of dilitation or  obstruction.   Venous: The inferior vena cava is normal in size with greater than 50%  respiratory variability, suggesting right atrial pressure of 3 mmHg.   IAS/Shunts: No atrial level shunt detected by color flow Doppler.      Recent Labs: No results found for  requested labs within last 365 days.   Recent Lipid Panel No results found for: "CHOL", "TRIG", "HDL", "CHOLHDL", "LDLCALC", "LDLDIRECT"  Physical Exam:    VS:  BP 102/60   Pulse 79   Ht 5\' 4"  (1.626 m)   Wt 127 lb 6.4 oz (57.8 kg)   SpO2 100%   BMI 21.87 kg/m     Wt Readings from Last 3 Encounters:  08/28/22 127 lb 6.4 oz (57.8 kg)  02/01/21 144 lb (65.3 kg)  01/30/21 146 lb 3.2 oz (66.3 kg)     GEN:  Well nourished, well developed in no acute distress HEENT: Normal  NECK: No JVD; No carotid bruits LYMPHATICS: No lymphadenopathy CARDIAC: RRR, 2/6 holosystolic murmurs, rubs, gallops RESPIRATORY:  Clear to auscultation without rales, wheezing or rhonchi  ABDOMEN: Soft, non-tender, non-distended MUSCULOSKELETAL:  No edema; No deformity  SKIN: Warm and dry NEUROLOGIC:  Alert and oriented x 3 PSYCHIATRIC:  Normal affect    Risk Assessment/Risk Calculators:     CARPREG II Risk Prediction Index Score:  1.  The patient's risk for a primary cardiac event is 5%.            ASSESSMENT & PLAN:    Palpitations - palpitations can be noted in pregnancy but with this case HR is trending up in the 130s and slightly higher  therefore will like to rule out arhythmia . We will go ahead and place a zio on the patient  Reviewed prior echo no need for repeating.  Encouraged increased salt and increased fluid intake.   [place] - we planned a virtual visit to discussed the her monitor result.  Patient Instructions  Medication Instructions:  Your physician recommends that you continue on your current medications as directed. Please refer to the Current Medication list given to you today.  *If you need a refill on your cardiac medications before your next appointment, please call your pharmacy*   Lab Work: None   Testing/Procedures: Christena DeemZIO XT- Long Term Monitor Instructions  Your physician has requested you wear a ZIO patch monitor for 14 days.  This is a  single patch monitor. [employer] supplies one patch monitor per enrollment. Additional stickers are not available. Please do not apply patch if you will be having a Nuclear Stress Test,  Echocardiogram, Cardiac CT, MRI, or Chest Xray during the period you would be wearing the  monitor. The patch cannot be worn during these tests. You cannot remove and re-apply the  ZIO XT patch monitor.  Your ZIO patch monitor will be mailed 3 day USPS to your address on file. It may take 3-5 days  to receive your monitor after you have been enrolled.  Once you have received your monitor, please review the enclosed instructions. Your monitor  has already been registered assigning a specific monitor serial # to you.  Billing and Patient Assistance Program Information  We have supplied [employer] with any of your insurance information on file for billing purposes. [employer] offers a sliding scale Patient Assistance Program for patients that do not have  insurance, or whose insurance does not completely cover the cost of the ZIO monitor.  You must apply for the Patient Assistance Program to qualify for this discounted rate.  To apply, please call [employer] at [phone], select option 4, select option 2, ask to apply for  Patient Assistance Program. Meredeth [employer] will ask your household income, and how many people  are in your household. They will quote your out-of-pocket cost based on that information.  [employer] will also be able to set up a 8140-month, interest-free payment plan if needed.  Applying the monitor   Shave hair from upper left chest.  Hold abrader disc by orange tab. Rub abrader in 40 strokes over the upper left chest as  indicated in your monitor instructions.  Clean area with 4 enclosed alcohol pads. Let dry.  Apply patch as indicated in monitor instructions. Patch will be placed under collarbone on left  side of chest with arrow pointing upward.  Rub patch adhesive wings for 2 minutes. Remove white label  marked "1". Remove the white  label marked "2". Rub patch adhesive wings for 2 additional minutes.  While looking in a mirror, press and release button in center of patch. A small green light will  flash 3-4 times. This will be your only indicator that the monitor has been turned on.  Do not shower for the first 24 hours. You may shower after the first 24 hours.  Press the button if you feel a symptom. You will hear a small click. Record Date, Time and  Symptom in the Patient Logbook.  When you are ready to remove the patch, follow instructions on the last 2 pages of Patient  Logbook. Stick patch monitor onto the last page of Patient Logbook.  Place Patient Logbook in the blue and white box. Use locking tab on box and tape box closed  securely. The blue and white box has prepaid postage on it. Please place it in the mailbox as  soon as possible. Your physician should have your test results approximately 7 days after the  monitor has been mailed back to [facility].  Call [facility] Customer Care at [phone] if you have questions regarding  your ZIO XT patch monitor. Call them immediately if you see an orange light blinking on your  monitor.  If your monitor falls off in less than 4 days, contact our Monitor department at [phone].  If your monitor becomes loose or falls off after 4 days call [employer] at [phone] for  suggestions on securing your monitor    Follow-Up: At [facility], you and your health needs are our priority.  As part of our continuing mission to provide you with exceptional heart care, we have created designated Provider Care Teams.  These Care Teams include your primary Cardiologist (physician) and Advanced Practice Providers (APPs -  Physician Assistants and Nurse Practitioners) who all work together to provide you with the care you need, when you need it.  Your next appointment:   12 week(s) via MyChart  Provider:   Thomasene Ripple, DO     Dispo:  No follow-ups on file.   Medication Adjustments/Labs and Tests Ordered: Current medicines are reviewed at length with the patient today.  Concerns regarding medicines are outlined above.  Tests Ordered: Orders Placed This Encounter  Procedures   LONG TERM MONITOR (3-14 DAYS)   EKG 12-Lead   Medication Changes: No orders of the defined types were placed in this encounter.

## 2022-08-28 NOTE — Progress Notes (Unsigned)
Enrolled patient for a 14 day Zio XT  monitor to be mailed to patients home  °

## 2022-08-28 NOTE — Patient Instructions (Signed)
Medication Instructions:  Your physician recommends that you continue on your current medications as directed. Please refer to the Current Medication list given to you today.  *If you need a refill on your cardiac medications before your next appointment, please call your pharmacy*   Lab Work: None   Testing/Procedures: [place] Monitor Instructions  Your physician has requested you wear a ZIO patch monitor for 14 days.  This is a single patch monitor. [employer] supplies one patch monitor per enrollment. Additional stickers are not available. Please do not apply patch if you will be having a Nuclear Stress Test,  Echocardiogram, Cardiac CT, MRI, or Chest Xray during the period you would be wearing the  monitor. The patch cannot be worn during these tests. You cannot remove and re-apply the  ZIO XT patch monitor.  Your ZIO patch monitor will be mailed 3 day USPS to your address on file. It may take 3-5 days  to receive your monitor after you have been enrolled.  Once you have received your monitor, please review the enclosed instructions. Your monitor  has already been registered assigning a specific monitor serial # to you.  Billing and Patient Assistance Program Information  We have supplied [employer] with any of your insurance information on file for billing purposes. [employer] offers a sliding scale Patient Assistance Program for patients that do not have  insurance, or whose insurance does not completely cover the cost of the ZIO monitor.  You must apply for the Patient Assistance Program to qualify for this discounted rate.  To apply, please call [employer] at [phone], select option 4, select option 2, ask to apply for  Patient Assistance Program. Theodore Demark will ask your household income, and how many people  are in your household. They will quote your out-of-pocket cost based on that information.  [employer] will also be able to set up a 59-month interest-free payment plan if  needed.  Applying the monitor   Shave hair from upper left chest.  Hold abrader disc by orange tab. Rub abrader in 40 strokes over the upper left chest as  indicated in your monitor instructions.  Clean area with 4 enclosed alcohol pads. Let dry.  Apply patch as indicated in monitor instructions. Patch will be placed under collarbone on left  side of chest with arrow pointing upward.  Rub patch adhesive wings for 2 minutes. Remove white label marked "1". Remove the white  label marked "2". Rub patch adhesive wings for 2 additional minutes.  While looking in a mirror, press and release button in center of patch. A small green light will  flash 3-4 times. This will be your only indicator that the monitor has been turned on.  Do not shower for the first 24 hours. You may shower after the first 24 hours.  Press the button if you feel a symptom. You will hear a small click. Record Date, Time and  Symptom in the Patient Logbook.  When you are ready to remove the patch, follow instructions on the last 2 pages of Patient  Logbook. Stick patch monitor onto the last page of Patient Logbook.  Place Patient Logbook in the blue and white box. Use locking tab on box and tape box closed  securely. The blue and white box has prepaid postage on it. Please place it in the mailbox as  soon as possible. Your physician should have your test results approximately 7 days after the  monitor has been mailed back to [facility]  Call [facility] Customer Care at [phone] if you have questions regarding  your ZIO XT patch monitor. Call them immediately if you see an orange light blinking on your  monitor.  If your monitor falls off in less than 4 days, contact our Monitor department at [phone].  If your monitor becomes loose or falls off after 4 days call [employer] at [phone] for  suggestions on securing your monitor    Follow-Up: At [facility], you and your health needs are  our priority.  As part of our continuing mission to provide you with exceptional heart care, we have created designated Provider Care Teams.  These Care Teams include your primary Cardiologist (physician) and Advanced Practice Providers (APPs -  Physician Assistants and Nurse Practitioners) who all work together to provide you with the care you need, when you need it.  Your next appointment:   12 week(s) via MyChart  Provider:   Thomasene Ripple, DO

## 2022-09-01 DIAGNOSIS — R002 Palpitations: Secondary | ICD-10-CM | POA: Diagnosis not present

## 2022-09-11 ENCOUNTER — Telehealth: Payer: Self-pay | Admitting: Nurse Practitioner

## 2022-09-11 NOTE — Telephone Encounter (Signed)
   Called this evening w/ sharp pain in her L shoulder and severe cramping in her epigastric area.  She has had similar symptoms in the past, even prior to her pregnancy.  She was recently evaluated in the office due to tachypalpitations and is currently wearing a Zio XT (no live telemetry available).  She has had palpitations since placing the monitor but is not experiencing any now.  While we're talking, cramping in her epigastric area has resolved.  Her L shoulder pain is now more dull in nature.  No assoc symptoms, and she is not in any distress.  I advised that if symptoms persist or remain concerning, that she should seek evaluation in the ED or urgent care.  She thinks she might just be very hungry and is going to eat something and see how she feels.  Nicolasa Ducking, NP 09/11/2022, 6:44 PM

## 2022-09-21 ENCOUNTER — Encounter: Payer: Self-pay | Admitting: Cardiology

## 2022-11-18 ENCOUNTER — Telehealth: Payer: BC Managed Care – PPO | Admitting: Cardiology
